# Patient Record
Sex: Male | Born: 1950 | Race: White | Hispanic: No | Marital: Married | Smoking: Former smoker
Health system: Southern US, Community
[De-identification: ages and names within clinical notes are randomized; demographics above are authoritative.]

## PROBLEM LIST (undated history)

## (undated) DIAGNOSIS — J449 Chronic obstructive pulmonary disease, unspecified: Secondary | ICD-10-CM

## (undated) DIAGNOSIS — I471 Supraventricular tachycardia, unspecified: Secondary | ICD-10-CM

## (undated) DIAGNOSIS — R739 Hyperglycemia, unspecified: Secondary | ICD-10-CM

## (undated) DIAGNOSIS — I10 Essential (primary) hypertension: Secondary | ICD-10-CM

## (undated) DIAGNOSIS — J869 Pyothorax without fistula: Secondary | ICD-10-CM

## (undated) DIAGNOSIS — Z720 Tobacco use: Secondary | ICD-10-CM

## (undated) HISTORY — DX: Supraventricular tachycardia: I47.1

## (undated) HISTORY — DX: Hyperglycemia, unspecified: R73.9

## (undated) HISTORY — DX: Supraventricular tachycardia, unspecified: I47.10

## (undated) HISTORY — DX: Essential (primary) hypertension: I10

## (undated) HISTORY — PX: CHOLECYSTECTOMY: SHX55

## (undated) HISTORY — PX: TONSILLECTOMY: SUR1361

## (undated) HISTORY — DX: Tobacco use: Z72.0

---

## 2008-03-29 ENCOUNTER — Ambulatory Visit: Payer: Self-pay | Admitting: Family Medicine

## 2010-10-24 ENCOUNTER — Ambulatory Visit: Payer: Self-pay | Admitting: Gastroenterology

## 2012-10-18 ENCOUNTER — Inpatient Hospital Stay: Payer: Self-pay | Admitting: Family Medicine

## 2012-10-18 LAB — URINALYSIS, COMPLETE
Blood: NEGATIVE
Glucose,UR: NEGATIVE mg/dL (ref 0–75)
Hyaline Cast: 1
Leukocyte Esterase: NEGATIVE
Nitrite: NEGATIVE
Protein: NEGATIVE
RBC,UR: 1 /HPF (ref 0–5)
Specific Gravity: 1.005 (ref 1.003–1.030)
Squamous Epithelial: <1
WBC UR: <1 /HPF (ref 0–5)

## 2012-10-18 LAB — COMPREHENSIVE METABOLIC PANEL
Albumin: 3.5 g/dL (ref 3.4–5.0)
Alkaline Phosphatase: 141 U/L — ABNORMAL HIGH (ref 50–136)
BUN: 20 mg/dL — ABNORMAL HIGH (ref 7–18)
Chloride: 106 mmol/L (ref 98–107)
Co2: 23 mmol/L (ref 21–32)
Creatinine: 1.16 mg/dL (ref 0.60–1.30)
EGFR (Non-African Amer.): >60
Potassium: 3.8 mmol/L (ref 3.5–5.1)
SGOT(AST): 33 U/L (ref 15–37)
SGPT (ALT): 32 U/L (ref 12–78)
Total Protein: 7 g/dL (ref 6.4–8.2)

## 2012-10-18 LAB — DRUG SCREEN, URINE
Amphetamines, Ur Screen: NEGATIVE (ref ?–1000)
Benzodiazepine, Ur Scrn: NEGATIVE (ref ?–200)
Cannabinoid 50 Ng, Ur ~~LOC~~: NEGATIVE (ref ?–50)
Methadone, Ur Screen: NEGATIVE (ref ?–300)
Opiate, Ur Screen: NEGATIVE (ref ?–300)
Phencyclidine (PCP) Ur S: NEGATIVE (ref ?–25)
Tricyclic, Ur Screen: NEGATIVE (ref ?–1000)

## 2012-10-18 LAB — CBC
HGB: 16.7 g/dL (ref 13.0–18.0)
MCH: 30.6 pg (ref 26.0–34.0)
RBC: 5.46 10*6/uL (ref 4.40–5.90)
WBC: 10.5 10*3/uL (ref 3.8–10.6)

## 2012-10-18 LAB — PROTIME-INR: INR: 1

## 2012-10-18 LAB — TROPONIN I: Troponin-I: <0.02 ng/mL

## 2012-10-18 LAB — TSH: Thyroid Stimulating Horm: 1.03 u[IU]/mL

## 2012-10-19 DIAGNOSIS — R079 Chest pain, unspecified: Secondary | ICD-10-CM

## 2012-10-19 DIAGNOSIS — I471 Supraventricular tachycardia: Secondary | ICD-10-CM

## 2012-10-19 DIAGNOSIS — I214 Non-ST elevation (NSTEMI) myocardial infarction: Secondary | ICD-10-CM

## 2012-10-19 DIAGNOSIS — R0602 Shortness of breath: Secondary | ICD-10-CM

## 2012-10-19 LAB — TROPONIN I: Troponin-I: 0.08 ng/mL — ABNORMAL HIGH

## 2012-10-19 LAB — CBC WITH DIFFERENTIAL/PLATELET
Basophil #: 0.1 10*3/uL (ref 0.0–0.1)
Basophil %: 0.8 %
Eosinophil #: 0.5 10*3/uL (ref 0.0–0.7)
HCT: 44 % (ref 40.0–52.0)
HGB: 14.9 g/dL (ref 13.0–18.0)
Lymphocyte #: 2.1 10*3/uL (ref 1.0–3.6)
Lymphocyte %: 28.8 %
MCH: 30 pg (ref 26.0–34.0)
MCHC: 33.8 g/dL (ref 32.0–36.0)
MCV: 89 fL (ref 80–100)
Monocyte #: 0.6 x10 3/mm (ref 0.2–1.0)
Monocyte %: 8.2 %
Neutrophil #: 3.9 10*3/uL (ref 1.4–6.5)
Neutrophil %: 54.9 %
WBC: 7.1 10*3/uL (ref 3.8–10.6)

## 2012-10-19 LAB — BASIC METABOLIC PANEL
BUN: 17 mg/dL (ref 7–18)
Chloride: 110 mmol/L — ABNORMAL HIGH (ref 98–107)
Glucose: 106 mg/dL — ABNORMAL HIGH (ref 65–99)
Potassium: 4.3 mmol/L (ref 3.5–5.1)
Sodium: 144 mmol/L (ref 136–145)

## 2012-10-19 LAB — LIPID PANEL
Cholesterol: 119 mg/dL (ref 0–200)
HDL Cholesterol: 29 mg/dL — ABNORMAL LOW (ref 40–60)
Triglycerides: 93 mg/dL (ref 0–200)
VLDL Cholesterol, Calc: 19 mg/dL (ref 5–40)

## 2012-10-19 LAB — CK TOTAL AND CKMB (NOT AT ARMC)
CK, Total: 313 U/L — ABNORMAL HIGH (ref 35–232)
CK-MB: 5.1 ng/mL — ABNORMAL HIGH (ref 0.5–3.6)

## 2012-10-19 LAB — HEMOGLOBIN A1C: Hemoglobin A1C: 5.7 % (ref 4.2–6.3)

## 2012-10-20 DIAGNOSIS — I471 Supraventricular tachycardia: Secondary | ICD-10-CM

## 2012-10-20 LAB — BASIC METABOLIC PANEL
Anion Gap: 4 — ABNORMAL LOW (ref 7–16)
Calcium, Total: 8.4 mg/dL — ABNORMAL LOW (ref 8.5–10.1)
Chloride: 109 mmol/L — ABNORMAL HIGH (ref 98–107)
Co2: 27 mmol/L (ref 21–32)
EGFR (Non-African Amer.): >60
Potassium: 3.9 mmol/L (ref 3.5–5.1)
Sodium: 140 mmol/L (ref 136–145)

## 2012-11-04 ENCOUNTER — Ambulatory Visit (INDEPENDENT_AMBULATORY_CARE_PROVIDER_SITE_OTHER): Payer: 59 | Admitting: Cardiovascular Disease

## 2012-11-04 ENCOUNTER — Encounter: Payer: Self-pay | Admitting: Cardiovascular Disease

## 2012-11-04 VITALS — BP 142/82 | HR 63 | Ht 74.0 in | Wt 224.2 lb

## 2012-11-04 DIAGNOSIS — R609 Edema, unspecified: Secondary | ICD-10-CM

## 2012-11-04 DIAGNOSIS — Z720 Tobacco use: Secondary | ICD-10-CM | POA: Insufficient documentation

## 2012-11-04 DIAGNOSIS — I498 Other specified cardiac arrhythmias: Secondary | ICD-10-CM

## 2012-11-04 DIAGNOSIS — I10 Essential (primary) hypertension: Secondary | ICD-10-CM

## 2012-11-04 DIAGNOSIS — I471 Supraventricular tachycardia, unspecified: Secondary | ICD-10-CM

## 2012-11-04 DIAGNOSIS — F17200 Nicotine dependence, unspecified, uncomplicated: Secondary | ICD-10-CM

## 2012-11-04 MED ORDER — LOSARTAN POTASSIUM-HCTZ 50-12.5 MG PO TABS: 1.0000 | ORAL_TABLET | Freq: Every day | ORAL | Status: DC

## 2012-11-04 NOTE — Assessment & Plan Note (Signed)
NSR in the office today. No symptoms to indicate paroxysms of SVT since discharge. Would hold off on schedule rate-control meds given bradycardia while inpatient. Would benefit from PRN Esmolol/Lopressor or short-acting diltiazem should episodes recur. Discussed vagal maneuvers and prompt EMS contact for further episodes.

## 2012-11-04 NOTE — Assessment & Plan Note (Addendum)
Suspect secondary to amlodipine and some dependence as he endorsed swelling prior to his admission. Will hold amlodipine in favor of losartan/hct 50/12.5 mg daily.

## 2012-11-04 NOTE — Assessment & Plan Note (Addendum)
BP only mildly elevated in the office today. Does have LE edema and weight gain since discharge. 2D echo did not indicate systolic/diastolic dysfunction. There was no evidence of PE. Pt reports having some swelling prior to his admission as well. Discontinue amlodipine in favor of losartan HCT 50/12.5 mg daily. If blood pressure continues to be elevated on the medication, could increase the dose to 100/25 mg daily

## 2012-11-04 NOTE — Assessment & Plan Note (Signed)
Discussed nicotine replacement therapy today as he continues to use. He would benefit from a nicotine patch taper for tobacco cessation assistance.

## 2012-11-04 NOTE — Patient Instructions (Addendum)
Hold the amlodipine for swelling Start losartan hct 50/12.5 one a day  Monitor the blood pressure If it runs high, call the office to increase the dose to 100/25 mg daily  Please call us if you have new issues that need to be addressed before your next appt.  Your physician wants you to follow-up in: 6 months.  You will receive a reminder letter in the mail two months in advance. If you don't receive a letter, please call our office to schedule the follow-up appointment.

## 2012-11-04 NOTE — Progress Notes (Signed)
Patient ID: Aaron Luna, male    DOB: 1951/01/22, 62 y.o.   MRN: [id number]  HPI Comments: Aaron Luna is a 62 yo male with no prior cardiac history, long smokign hx,  who was admitted at [facility] from 10/18/12 to 10/20/12. He presented to the ED after experiencing severe 10/10 substernal chest pressure while working out in the yard with shortness of breath, nausea and diaphoresis. Upon EMS arrival, he was found to be in SVT which converted to NSR after adenosine 6 mg then 12 mg. He immediately felt better. He did have a resultant type 2 NSTEMI.   Stress Myoview revewaled no evidence of ischemia, EF 59% and GI/diaphragmatic attenuation artifact.   2D echo LVEF 55%, normal valvular structure and functoin and normal RVSP.   CT-A chest indicated no evidence of PE, but did reveal indeterminate pulmonary nodules </= 4 mm. Follow-up CT recommended in 12 months.   He had no further episodes of SVT while inpatient. He became bradycardic on Lopressor 25mg  BID, and this was discontinued. He was started on Norvasc for HTN.  LDL in the hospital was 71, HDL 29. TSH WNL. He was discharged on low-dose ASA and fish oil. He is an ongoing tobacco user. He has reduced his use, but continues to smoke 2-3 cigarettes/day. He refuses NRT today.   He does endorse ~ 8 lbs weight gain and LE edema since being discharged.  He otherwise denies chest pain, palpitations, lightheadedness, orthopnea, PND or DOE/SOB. He feels well today. He is an ongoing tobacco user. He has reduced his use, but continues to smoke 2-3 cigarettes/day. He refuses NRT today.   EKG shows NSR with no significant ST or T wave changes.    Outpatient Encounter Prescriptions as of 11/04/2012  Medication Sig Dispense Refill  . aspirin 81 MG tablet Take 81 mg by mouth daily.      . fish oil-omega-3 fatty acids 1000 MG capsule Take 1 g by mouth daily.      Marland Kitchen amLODipine (NORVASC) 5 MG tablet Take 5 mg by mouth daily.         Review of Systems   Constitutional: Negative.  Negative for fatigue.  HENT: Positive for neck stiffness.   Eyes: Negative.   Respiratory: Negative.  Negative for cough, chest tightness and shortness of breath.   Cardiovascular: Positive for leg swelling. Negative for chest pain and palpitations.  Gastrointestinal: Negative.  Negative for nausea and abdominal pain.  Skin: Negative.   Neurological: Negative.  Negative for dizziness.  Psychiatric/Behavioral: Negative.   All other systems reviewed and are negative.    BP 142/82  Pulse 63  Ht 6\' 2"  (1.88 m)  Wt 224 lb 4 oz (101.719 kg)  BMI 28.78 kg/m2   Physical Exam  Nursing note and vitals reviewed. Constitutional: He is oriented to person, place, and time. He appears well-developed and well-nourished.  HENT:  Head: Normocephalic and atraumatic.  Nose: Nose normal.  Mouth/Throat: Oropharynx is clear and moist.  Eyes: Conjunctivae are normal. Pupils are equal, round, and reactive to light.  Neck: Normal range of motion. Neck supple. No JVD present. No tracheal deviation present.  Cardiovascular: Normal rate, regular rhythm, S1 normal, S2 normal, normal heart sounds and intact distal pulses.  Exam reveals no gallop and no friction rub.   No murmur heard. Pulmonary/Chest: Effort normal and breath sounds normal. No respiratory distress. He has no wheezes. He has no rales. He exhibits no tenderness.  Abdominal: Soft. Bowel sounds are normal.  He exhibits no distension. There is no tenderness. There is no rebound and no guarding.  Musculoskeletal: Normal range of motion. He exhibits edema. He exhibits no tenderness.  1-2+ bilateral pretibial pitting edema  Lymphadenopathy:    He has no cervical adenopathy.  Neurological: He is alert and oriented to person, place, and time. Coordination normal.  Skin: Skin is warm and dry. No rash noted. No erythema.  Psychiatric: He has a normal mood and affect. His behavior is normal. Judgment and thought content normal.       Assessment and Plan

## 2013-01-07 ENCOUNTER — Telehealth: Payer: Self-pay | Admitting: *Deleted

## 2013-01-07 ENCOUNTER — Other Ambulatory Visit: Payer: Self-pay

## 2013-01-07 MED ORDER — LOSARTAN POTASSIUM-HCTZ 100-25 MG PO TABS: 1.0000 | ORAL_TABLET | Freq: Every day | ORAL | Status: DC

## 2013-01-07 NOTE — Telephone Encounter (Signed)
Patient is going to fax over his BP readings today and wants to know if he needs to change any of the bp medications, he is almost out of his meds and wants the RN to call him back today after receiving the readings.

## 2013-01-07 NOTE — Telephone Encounter (Signed)
FYI

## 2013-01-07 NOTE — Telephone Encounter (Signed)
Pt faxed BP readings with average BP=151/86, HR=77 He is out of losartan/HCT 25/12.5 I advised, per Dr. Windell Hummingbird last office note, to go ahead and increase losartan/HCT to 50/25 mg daily and to continue to monitor BP Understanding verb RX sent to pharmacy

## 2013-01-20 ENCOUNTER — Other Ambulatory Visit: Payer: Self-pay | Admitting: Cardiovascular Disease

## 2013-01-20 ENCOUNTER — Telehealth: Payer: Self-pay

## 2013-01-20 NOTE — Telephone Encounter (Signed)
Pt says since switching to higher losartan/HCTZ dose (50/25), he has been dizzy and near syncopal He held medications x 3 days and symptoms resolved BP remains elevated=155/96 Still c/o LE edema  I advised he go back taking losartan/HCT 25/12.5 mg daily since this helped his BP a little bit more  In mean time, I will ask Dr. Mariah Milling if we need to switch him to a different medication for BP, etc Understanding verb

## 2013-01-20 NOTE — Telephone Encounter (Signed)
Losartan hctz could have made him dehydrated? Like he was taking 50/12.5 mg daily?  Best thing may be for [place] to split the 2 medications and for him to try losartan 50 mg daily We could add low-dose HCTZ later if needed if blood pressure runs high as a separate pill

## 2013-01-20 NOTE — Telephone Encounter (Signed)
Pt states new medication Hyzaar has caused him to be swimmy headed and passing out. Pt states he has stopped this. Please advise on what he should take

## 2013-01-21 ENCOUNTER — Other Ambulatory Visit: Payer: Self-pay

## 2013-01-21 MED ORDER — LOSARTAN POTASSIUM 25 MG PO TABS: 25.0000 mg | ORAL_TABLET | Freq: Every day | ORAL | Status: DC

## 2013-01-21 NOTE — Telephone Encounter (Signed)
Pt informed Would like to try losartan 25 mg daily first then can increase to 50 mg if he tolerates RX sent to pharm

## 2013-03-08 ENCOUNTER — Telehealth: Payer: Self-pay

## 2013-03-08 DIAGNOSIS — R609 Edema, unspecified: Secondary | ICD-10-CM

## 2013-03-08 DIAGNOSIS — I10 Essential (primary) hypertension: Secondary | ICD-10-CM

## 2013-03-08 NOTE — Telephone Encounter (Signed)
One option would be to hold losartan  continue HCTZ 25 mg daily Also start clonidine 0.1 mg twice a day  Would track blood pressures and call us back with numbers

## 2013-03-08 NOTE — Telephone Encounter (Signed)
Called spoke with pt took last dosage of Losartan yesterday am, swelling in B feet and hands is improving.  Pt states he monitoring BP regularly never has gotten below 140/90's on the Losartan.  Started on Losartan/HCT in 05/14, stopped changed to Losartan 25mg  QD and has been taking since 6/14.  Please advise.

## 2013-03-08 NOTE — Telephone Encounter (Signed)
Pt states he has swelling in his ankles, left and right hands, states he went off Losartin 2 days ago, and his swelling has gotten better. States this medication never did help his BP, it was never below 140/90. Please call

## 2013-03-09 MED ORDER — HYDROCHLOROTHIAZIDE 25 MG PO TABS: 25.0000 mg | ORAL_TABLET | Freq: Every day | ORAL | Status: DC

## 2013-03-09 MED ORDER — CLONIDINE HCL 0.1 MG PO TABS: 0.1000 mg | ORAL_TABLET | Freq: Two times a day (BID) | ORAL | Status: DC

## 2013-03-09 NOTE — Telephone Encounter (Signed)
I spoke with the patient. He has been holding losartan for 48 hours. His swelling is improving. I have advised of Dr. Windell Hummingbird recommendations to continue off losartan and start HCTZ 25 mg daily and clonidine 0.1 mg BID. The patient is agreeable. Will send RX's to [facility] in [place]. The patient has been instructed to track his BP for the next 2 weeks and call us back with his readings. He has been advised to call sooner with any concerns. He voices understanding.

## 2013-07-15 ENCOUNTER — Ambulatory Visit: Payer: Self-pay

## 2013-10-30 ENCOUNTER — Ambulatory Visit: Payer: Self-pay | Admitting: Physician Assistant

## 2014-03-02 ENCOUNTER — Other Ambulatory Visit: Payer: Self-pay | Admitting: *Deleted

## 2014-03-02 DIAGNOSIS — I10 Essential (primary) hypertension: Secondary | ICD-10-CM

## 2014-03-02 MED ORDER — CLONIDINE HCL 0.1 MG PO TABS: 0.1000 mg | ORAL_TABLET | Freq: Two times a day (BID) | ORAL | Status: DC

## 2014-03-02 NOTE — Telephone Encounter (Signed)
Requested Prescriptions   Signed Prescriptions Disp Refills  . cloNIDine (CATAPRES) 0.1 MG tablet 60 tablet 1    Sig: Take 1 tablet (0.1 mg total) by mouth 2 (two) times daily.    Authorizing Provider: Antonieta IbaGOLLAN, TIMOTHY J    Ordering User: Kendrick FriesLOPEZ, Nicki Furlan C

## 2014-12-02 NOTE — Discharge Summary (Signed)
PATIENT NAME:  Aaron Luna, Aaron Luna MR#:  [id number] DATE OF BIRTH:  02/25/1951  DATE OF ADMISSION:  10/18/2012 DATE OF DISCHARGE:  10/20/2012  REASON FOR ADMISSION:  Chest pain and supraventricular tachycardia.   DISCHARGE DIAGNOSES: 1.  New-onset supraventricular tachycardia.  2.  Chest pain.  3.  Hypertension.  4.  Hyperglycemia.  5.  Tobacco abuse.   MEDICATIONS AT DISCHARGE:  Aspirin 81 mg once daily, amlodipine 5 mg once daily, fish oil 1000 mg 3 times a day.   CONSULTANT:  Dr. Julien Nordmann.   FOLLOWUP: Dr. Julien Nordmann in the [facility] in 1 to 2 weeks, and Dr. Elizabeth Sauer, primary care physician, also in 1 to 2 weeks.   IMPORTANT RESULTS:  1.  Echocardiogram showing left ventricular ejection fraction of 55%, essentially normal . All cardiac valves are normal in structure and function with normal right ventricular systolic pressures.  2.  Nuclear stress test showed perfusion imaging study with no evidence of significant ischemia. There is an attenuation-corrected image needed secondary to GI uptake artifact and diaphragmatic attenuation artifact. Ejection fraction of 59%. No significant EKG changes concerning for ischemia. No recent scan.  3.  CT of the chest to rule out pulmonary embolism did not show any signs of pulmonary embolism. There are indeterminate pulmonary nodules which are 4 mm or less. The patient is a smoker, for which he needs a 53-month followup with a noncontrast CT to be ordered by  Dr. Elizabeth Sauer.   Admission glucose 160, but normalized to 93 at discharge. Electrolytes within normal limits. Kidney function within normal limits. LDL of 71, HDL of 29.   LFTs within normal limits, except for mild elevation of the alkaline phosphatase to 141. Troponin jumped from 0.02 to 0.08, and at discharge 0.05. TSH 1.03. UDS negative. Hemoglobin of 16.7, white count of 10.5. UA: No signs of urinary infection.   EKG: Supraventricular tachycardia on admission. Normal  sinus rhythm at discharge.   HOSPITAL COURSE: The patient is a very nice 64 year old gentleman who has a history of smoking. Presented to the ER with new-onset supraventricular tachycardia.   Pt having shortness of breath with chest pressure and sweating. Apparently, the patient was working in his yard. He was picking up some of the leaves from the trees that were on the floor and after awhile started complaining of chest pain. He suddenly felt the acute chest pain, pressure-like, followed by significant shortness of breath and nausea. He was sweating, and he felt like an elephant was sitting on the level of his chest. This has never happened before to this intensity; although, the patient states that he has occasional chest pain that happened within the same setting with similar characteristics, but never as intense.   EMS was called by his wife. He was found to be in narrow-complex tachycardia/SVT above 200, for which he was given adenosine of 6 mg, followed by 12 mg.   The SVT broke down into normal sinus rhythm, and the patient started feeling significantly better right away. He was admitted for evaluation of this problem.   1.  New-onset supraventricular tachycardia: The patient had a slight elevation of troponin, which was due to the significant elevation of the heart rate. He did not have any significant concerns of acute coronary syndrome at the moment; although, due to his risk factors, hypertension, smoking, and pains similar to those, Cardiology was consulted. With it, a stress test Myoview scan was overall negative. The patient was advised that if  he continues to have the pains that he has been having occasionally to go back and see  Cardiology.  2.  The patient also was recommended that if he started having some significant palpitations or increase of his heart rate at the same levels to visit Cardiology as well. I spoke with Dr. Mariah MillingGollan,  cardiology, who states that at this moment since this  is the first event and significant and since most of his workup was negative, he does not need to have Holter monitoring, but if this happens again he could visit [facility] and get set up for one.  3.  As far as his tobacco abuse, the patient was given extensive counseling. I offered help. He states that he is going to quit.  4.  As far as his newly-diagnosed hyperlipidemia, the patient has normal, very good LDL, but a very low HDL which is likely due to smoking, for which smoking cessation was again recommended. The patient was discharged on fish oil.  5.  As far as his blood pressure, blood pressure has never been treated, or at least he states that he is not taking medications. His blood pressure was significantly elevated at admission for which he was started on metoprolol to help the SVT. Unfortunately, the patient's heart rate dropped down to the 50s, for which we had to stop metoprolol and start him on amlodipine. The patient is discharged on a low-dose of amlodipine, to follow up with Dr. Elizabeth Sauereanna Jones for adjustment of the dose.  6.  As far as any other medical problems, they were stable. The patient was discharged in good condition.   I spent about 45 minutes with this patient the day of discharge. The case was discussed also with Dr. Mariah MillingGollan.    ____________________________ Felipa Furnaceoberto Sanchez Gutierrez, MD rsg:dm D: 10/24/2012 14:05:00 ET T: 10/25/2012 10:58:28 ET JOB#: [id number]  cc: Duanne Limerickeanna C. Jones, MD Antonieta Ibaimothy J. Gollan, MD Felipa Furnaceoberto Sanchez Gutierrez, MD, <Dictator>  [name] ELECTRONICALLY SIGNED 11/05/2012 17:18

## 2014-12-02 NOTE — H&P (Signed)
DATE OF BIRTH:  [birth year]  DATE OF ADMISSION:  10/18/2012  REFERRING PHYSICIAN:  Dr. Benjaman Lobe  PRIMARY CARE PHYSICIAN:  Dr.  Otilio Miu   CHIEF COMPLAINT:  Sudden shortness of breath, with chest pressure and sweating.   HISTORY OF PRESENT ILLNESS:  This is a 64 year old Caucasian male with past medical history of hypertension, previous cholecystectomy, tobacco abuse, who presented with the above chief complaint. History is per the patient. The patient states that today he was out in the yard doing wood chopping, fallen trees and taking care of some landscaping, when all of a sudden he felt an acute onset of chest pressure followed by shortness of breath and nausea. He was already sweating, but he thinks he broke out in some more sweats. He said he felt like an elephant was sitting on his chest, and he has never felt anything like this before. He wife called EMS and he was brought to the ED right away, where he was found to be in SVT, narrow complex heart rate greater than 200. He was given adenosine twice, the first dose was 6 mg followed by 12 mg. This broke his SVT back into sinus rhythm. He also given a dose of Lovenox by the ED physician. CKMB was noted to be elevated at 5.3, and hospitalist was consulted for further inpatient workup and management. At the time of my evaluation, patient denies any chest pain, chest pressure, shortness of breath, numbness in his left shoulder, or any nausea or vomiting.   PAST MEDICAL HISTORY:  Hypertension, tobacco abuse.   MEDICATIONS:  None.  PAST SURGICAL HISTORY:  Cholecystectomy years ago.   ALLERGIES:  No known drug allergies.   FAMILY HISTORY:  Father and mother with heart disease.   SOCIAL HISTORY:  A smoker, occasional drinker.   REVIEW OF SYSTEMS AS FOLLOWS:  CONSTITUTIONAL:  No fever, fatigue. No weakness. Chest pain positive. No weight loss or weight gain.  EYES:  No blurry vision, double vision, pain or redness.  EARS, NOSE, THROAT:   No tinnitus, ear pain, hearing loss. No seasonal allergies.  RESPIRATORY: Positive shortness of breath. Positive mild chronic cough, from smoking is what he says. No COPD, TB or pneumonia.  CARDIOVASCULAR: Positive chest pain. Positive chest pressure. Positive diaphoresis. Positive high blood pressure. Positive arrhythmia.  GASTROINTESTINAL: Positive nausea. Otherwise, no vomiting, diarrhea, hematemesis, melena. Also positive GERD symptoms, is what he said.   ENDOCRINOLOGY:  No polyuria, nocturia, thyroid problems. No increased sweating. No heat or cold intolerance.  MUSCULOSKELETAL: No pain in neck, back, shoulder, knee, hips, or arthritis.  NEUROLOGIC:  No numbness, no weakness, no dysarthria. No epilepsy, tremor or vertigo.  PSYCHIATRIC:  No anxiety, OCD, bipolar or depression.   PHYSICAL EXAMINATION:  VITAL SIGNS:  In the ED, his vital signs were noted to be temperature of 97, blood pressure 191/116, saturating 100% on oxygen, it looks like 2 liters, respirations 27, pulse 92.  GENERAL APPEARANCE: Well-developed, well-nourished male lying in bed in no acute respiratory distress.  HEENT: PERRLA, EOMI. No scleral icterus noted. No difficulty hearing. No pharyngeal erythema. Mucous membranes are moist. Dentition is fair.  NECK: No thyroid enlargement, nodules or tenderness. Neck is supple, nontender. No adenopathy is noted. No JVD or carotid bruits.  RESPIRATORY: Clear to auscultation bilaterally. No rales, rhonchi, crackles or diminished breath sounds.  CARDIOVASCULAR: Regular rate, regular rhythm. No murmurs. S1, S2 auscultated. No PMI lateralization is noted. No lower extremity edema.  ABDOMEN: Soft, nontender, nondistended. Positive bowel  sounds.  MUSCULOSKELETAL: Strength five out of five bilateral upper and lower extremities. No cyanosis, DJD, kyphosis.  SKIN:  No rash, lesions, erythema, nodules. Skin is warm and dry.  LYMPHATIC:  No adenopathy noted in the cervical, axillary or  supraclavicular regions.  NEUROLOGIC: Cranial nerves II through XII intact. Deep tendon reflexes intact. Sensory is intact.  PSYCHIATRIC:  Alert, oriented to person, time and place. Cooperative. Good judgment is noted.   PERTINENT LABS ARE AS FOLLOWS:  Sodium 130, potassium 3.8, chloride 106, BUN 20, creatinine 1.16, glucose 160. AST 33, ALT 32, alk phos 141. CKMB was noted to be at 5.3. Total CK 354. Troponin less than 0.02. Thyroid 1.03. Drug screen is negative. CBC is as follows:  White cell count 10.5, hemoglobin 16.7, hematocrit 48.5, platelet count 235. PT/INR 13/1.0. His UA was negative. EKG, initially when he came in, showed narrow complex tachycardia consistent with SVT, broke with 2 doses of adenosine. Currently he is in normal sinus rhythm at a rate of 90 to 100, with no acute ST-T wave changes. He does not have a current chest x-ray, it is currently pending, and the CT of the chest is pending.    ASSESSMENT AND PLAN IS AS FOLLOWS:  A 64 year old male with past medical history of hypertension and tobacco abuse, with acute onset shortness of breath, chest pressure and chest pain, found to be in supraventricular tachycardia in the Emergency Department.  1.  New onset supraventricular tachycardia, currently in normal sinus rhythm with adenosine x 2, now with elevated CKMB. Patient got Lovenox and adenosine in the ED. Will continue with telemetry monitoring, check cardiac enzymes x 3, EKG in the morning. Cardiology consult in the morning for possible stress testing evaluation. Check a CT of the chest. The patient did present with acute shortness of breath. He is a smoker, and he does have risk factors associated with it. Continue medical management with aspirin, beta blocker, statin, nitro sublingual, p.r.n. morphine and p.r.n. O2. Check a lipid panel and hemoglobin A1c also. Will also check an echo.  2.  Hypertension. Blood pressure elevated in the ED in the 389H systolic. Has not taken any  medications for the past 1.5 years and previously seen by Dr. Ronnald Ramp, but stopped taking his meds. I suspect that his blood pressure has been running high for the past year, and therefore will not acutely lower his blood pressure in the next 24 hours. However, goal blood pressure for the next 24 hours will be about 734 to 287 systolic. Postop metoprolol, and add p.r.n. if needed.   3.  Hyperglycemia. Check a hemoglobin A1c and monitor his fasting blood glucose levels.   4.  Tobacco abuse. The patient had consult with tobacco cessation. Will start a nicotine patch.   Time spent dictating and evaluating the patient:  50 minutes.     ____________________________ Vilinda Boehringer, MD vm:mr D: 10/18/2012 21:26:56 ET T: 10/18/2012 21:52:37 ET JOB#: [id number]  cc: Vilinda Boehringer, MD, <Dictator> Vilinda Boehringer MD ELECTRONICALLY SIGNED 10/19/2012 1:01

## 2014-12-02 NOTE — Consult Note (Signed)
General Aspect Aaron Luna is a 64yo male with PMHx s/f HTN and ongoing tobacco abuse who was admitted to [facility] yesterday with chest tightness and new onset SVT.   Present Illness He reports experiencing intermittent L-sided chest squeezing at rest lasting for <1 minute at a time x 2 months. He was without power yesterday and was chopping wood outside when he experienced a sudden chest tightess like a "truck on my chest" radiating to his left arm with associated nausea, diaphoresis and shortness of breath. He was weak, and had to be helped inside. There, he chewed two full-dose ASA and EMS was called. Wife notes "he hasn't felt right" x 2 months. No active bleeding. No fevers or chills. No increased caffeine or alcohol use. Father had bypass in his 92s.   In the ED, EKG revealed SVT. Initial trop-I returned WNL. CBC, U/A, TSH and UDS WNL. LFTs unremarkable. He was given adenosine 6 mg x 1, then 12 mg x 1 with conversion to NSR. He was started on Lovenox and admitted by the medicine service. VSS. He was started on full-dose ASA, BB, statin, NTG SL PRN. A subsequent trop-I returned mildly elevated and the last set returned WNL.   A1C 5.7%. Lipid panel- LDL 71, HDL 29, TG 93, TC 119.   PAST MEDICAL HISTORY:  Hypertension, tobacco abuse.   MEDICATIONS:  None.  PAST SURGICAL HISTORY:  Cholecystectomy years ago.   ALLERGIES:  No known drug allergies.   FAMILY HISTORY:  Father and mother with heart disease.   SOCIAL HISTORY:  A smoker, occasional drinker.   Physical Exam:  GEN no acute distress   HEENT PERRL, hearing intact to voice   NECK supple  No masses  trachea midline   RESP normal resp effort  clear BS  no use of accessory muscles  trace wheezing in posterior lung fields   CARD Regular rate and rhythm  Normal, S1, S2  No murmur   ABD denies tenderness  denies Flank Tenderness  soft  no Adominal Mass   EXTR negative cyanosis/clubbing, negative edema   SKIN No rashes, No ulcers    NEURO motor/sensory function intact   PSYCH A+O to time, place, person, good insight   Review of Systems:  Subjective/Chief Complaint chest pain   General: No Complaints  Fatigue   Skin: No Complaints   ENT: No Complaints   Eyes: No Complaints   Neck: No Complaints   Respiratory: Short of breath  Wheezing   Cardiovascular: Chest pain or discomfort   Gastrointestinal: No Complaints   Genitourinary: No Complaints   Vascular: No Complaints   Musculoskeletal: No Complaints   Neurologic: No Complaints   Hematologic: No Complaints   Endocrine: No Complaints   Psychiatric: No Complaints   Review of Systems: All other systems were reviewed and found to be negative   Medications/Allergies Reviewed Medications/Allergies reviewed     Hypertension:    Cholecystectomy:    Tonsillectomy:        Admit Diagnosis:   SVT: Onset Date: 19-Oct-2012, Status: Active, Description: SVT      Admit Reason:   Acute coronary syndrome (411.1): Status: Active, Coding System: ICD9, Coded Name: Intermediate coronary syndrome   SVT (supraventricular tachycardia) (427.89): Status: Active, Coding System: ICD9, Coded Name: Other specified cardiac dysrhythmias    Sodium Chloride 0.9% injection, 2 ml, IV push, q6h, 18-Oct-2012, Active, Standard   Adenosine injection, 6 mg, IV push, once  Indication: SVT/ Diagnostic Aid Myocardial Perfusion, Rapid push followed  by rapid saline flush., 18-Oct-2012, Completed, Standard   Adenosine injection, 12 mg, IV push, once  Indication: SVT/ Diagnostic Aid Myocardial Perfusion, Rapid push followed by rapid saline flush., 18-Oct-2012, Completed, Standard   Acetaminophen * tablet, ( Tylenol (325 mg) tablet)  650 mg Oral q4h PRN for pain or temp. greater than 100.4  - Indication: Pain/Fever, 18-Oct-2012, Active, Standard   Aspirin Enteric Coated tablet, ( Ecotrin)  325 mg Oral daily  - Indication: Pain/Fever/Thromboembolic Disorders/Post  MI/Prophylaxis MI  Instructions:  Initiate Bleeding Precautions Protocol--DO NOT CRUSH, 18-Oct-2012, Active, Standard   Enoxaparin injection, ( Lovenox injection )  40 mg, Subcutaneous, daily  Indication: Prophylaxis or treatment of thromboembolic disorders, Monitor Anticoags per hospital protocol, 18-Oct-2012, Discontinued, Standard   meTOProlol tartrate tablet, ( Lopressor)  50 mg Oral q12h  - Indication: Antihyperensive/ Angina, 18-Oct-2012, Discontinued, Standard   Ondansetron injection, ( Zofran injection )  4 mg, IV push, q4h PRN for Nausea/Vomiting  Indication: Nausea/ Vomiting, 18-Oct-2012, Active, Standard   Pantoprazole tablet, 40 mg Oral q6am  - Indication: Erosive Esophagitis/ GERD  Instructions:  DO NOT CRUSH, 18-Oct-2012, Active, Standard   atorvaSTATin tablet,  ( Lipitor)  20 mg Oral daily  - Indication: Hypercholesterolemia, 18-Oct-2012, Active, Standard   Nitroglycerin tablet, ( Nitrostat SL)  0.4 mg Sublingual Q5M PRN for chest pain  - Indication: Angina/ Hypertension  Instructions:  q 5 minutes x 3 doses PRN, 18-Oct-2012, Active, Standard   meTOProlol injection,  ( Lopressor injection )  5 mg, IV push, q6h PRN for hypertension  Indication: Antihypertensive/ Angina, give for SBP>116mHg  Hold for HR <65 bpm, 18-Oct-2012, Active, Standard   Enoxaparin injection,  ( Lovenox injection )  40 mg, Subcutaneous, daily  Indication: Prophylaxis or treatment of thromboembolic disorders, Monitor Anticoags per hospital protocol, 19-Oct-2012, Active, Standard   meTOProlol tartrate tablet, ( Lopressor)  25 mg Oral bid  - Indication: Antihypertensive/ Angina  Instructions:  hold for hr < 55., 19-Oct-2012, Active, Standard   Nicotine Patch, ( Habitrol patch )  14 mg Transdermal daily  -Indication:Smoking Cessation  Instructions:  [Dustin FolksCode: Black with pkg], 19-Oct-2012, Active, Standard  Lab Results:  Cardiology:  09-Mar-14 16:51   Ventricular Rate 195  Atrial  Rate 202  QRS Duration 84  QT 236  QTc 425  R Axis 69  T Axis -36  ECG interpretation Supraventricular tachycardia Septal infarct , 65 undetermined Marked ST abnormality, possible inferior subendocardial injury Abnormal ECG No previous ECGs available ----------unconfirmed---------- Confirmed by OVERREAD, NOT ([id number]), editor PEARSON, BARBARA ([id number] on 10/19/2012 1:38:13 PM  Routine Chem:  10-Mar-14 01:55   Result Comment TROPONIN - RESULTS VERIFIED BY REPEAT TESTING.  - C/BROOKE ROBERTSON AT 078293/10/14.PMH  - READ-BACK PROCESS PERFORMED.  Result(s) reported on 19 Oct 2012 at 02:59AM.  Glucose, Serum  106  BUN 17  Creatinine (comp) 0.95  Sodium, Serum 144  Potassium, Serum 4.3  Chloride, Serum  110  CO2, Serum 30  Calcium (Total), Serum  8.2  Anion Gap  4  Osmolality (calc) 289  eGFR (African American) >60  eGFR (Non-African American) >60 (eGFR values <663mmin/1.73 m2 may be an indication of chronic kidney disease (CKD). Calculated eGFR is useful in patients with stable renal function. The eGFR calculation will not be reliable in acutely ill patients when serum creatinine is changing rapidly. It is not useful in  patients on dialysis. The eGFR calculation may not be applicable to patients at the low and high extremes of body sizes, pregnant women, and vegetarians.)  Magnesium, Serum 2.2 (1.8-2.4 THERAPEUTIC RANGE: 4-7 mg/dL TOXIC: > 10 mg/dL  -----------------------)  Hemoglobin A1c ([facility]) 5.7 (The American Diabetes Association recommends that a primary goal of therapy should be <7% and that physicians should reevaluate the treatment regimen in patients with HbA1c values consistently >8%.)  Cholesterol, Serum 119  Triglycerides, Serum 93  HDL (INHOUSE)  29  VLDL Cholesterol Calculated 19  LDL Cholesterol Calculated 71 (Result(s) reported on 19 Oct 2012 at 02:21AM.)  Urine Drugs:  [id number] 69:67   Tricyclic Antidepressant, Ur Qual (comp) NEGATIVE (Result(s) reported  on 18 Oct 2012 at 07:51PM.)  Amphetamines, Urine Qual. NEGATIVE  MDMA, Urine Qual. NEGATIVE  Cocaine Metabolite, Urine Qual. NEGATIVE  Opiate, Urine qual NEGATIVE  Phencyclidine, Urine Qual. NEGATIVE  Cannabinoid, Urine Qual. NEGATIVE  Barbiturates, Urine Qual. NEGATIVE  Benzodiazepine, Urine Qual. NEGATIVE (----------------- The URINE DRUG SCREEN provides only a preliminary, unconfirmed analytical test result and should not be used for non-medical  purposes.  Clinical consideration and professional judgment should be  applied to any positive drug screen result due to possible interfering substances.  A more specific alternate chemical method must be used in order to obtain a confirmed analytical result.  Gas chromatography/mass spectrometry (GC/MS) is the preferred confirmatory method.)  Methadone, Urine Qual. NEGATIVE  Cardiac:  10-Mar-14 01:55   CK, Total  313  CPK-MB, Serum  5.1 (Result(s) reported on 19 Oct 2012 at 02:45AM.)  Troponin I  0.08 (0.00-0.05 0.05 ng/mL or less: NEGATIVE  Repeat testing in 3-6 hrs  if clinically indicated. >0.05 ng/mL: POTENTIAL  MYOCARDIAL INJURY. Repeat  testing in 3-6 hrs if  clinically indicated. NOTE: An increase or decrease  of 30% or more on serial  testing suggests a  clinically important change)    09:51   CK, Total  309  CPK-MB, Serum  5.0 (Result(s) reported on 19 Oct 2012 at 11:03AM.)  Troponin I 0.05 (0.00-0.05 0.05 ng/mL or less: NEGATIVE  Repeat testing in 3-6 hrs  if clinically indicated. >0.05 ng/mL: POTENTIAL  MYOCARDIAL INJURY. Repeat  testing in 3-6 hrs if  clinically indicated. NOTE: An increase or decrease  of 30% or more on serial  testing suggests a  clinically important change)  Routine UA:  09-Mar-14 19:08   Color (UA) Straw  Clarity (UA) Clear  Glucose (UA) Negative  Bilirubin (UA) Negative  Ketones (UA) Trace  Specific Gravity (UA) 1.005  Blood (UA) Negative  pH (UA) 6.0  Protein (UA) Negative   Nitrite (UA) Negative  Leukocyte Esterase (UA) Negative (Result(s) reported on 18 Oct 2012 at 07:40PM.)  RBC (UA) 1 /HPF  WBC (UA) <1 /HPF  Bacteria (UA) NONE SEEN  Epithelial Cells (UA) <1 /HPF  Hyaline Cast (UA) 1 /LPF (Result(s) reported on 18 Oct 2012 at 07:40PM.)  Routine Hem:  10-Mar-14 01:55   WBC (CBC) 7.1  RBC (CBC) 4.96  Hemoglobin (CBC) 14.9  Hematocrit (CBC) 44.0  Platelet Count (CBC) 196  MCV 89  MCH 30.0  MCHC 33.8  RDW 12.9  Neutrophil % 54.9  Lymphocyte % 28.8  Monocyte % 8.2  Eosinophil % 7.3  Basophil % 0.8  Neutrophil # 3.9  Lymphocyte # 2.1  Monocyte # 0.6  Eosinophil # 0.5  Basophil # 0.1 (Result(s) reported on 19 Oct 2012 at 02:11AM.)   Radiology Results: XRay:    09-Mar-14 18:36, Chest Portable Single View  Chest Portable Single View   REASON FOR EXAM:    sob  COMMENTS:  PROCEDURE: DXR - DXR PORTABLE CHEST SINGLE VIEW  - Oct 18 2012  6:36PM     RESULT: The lungs are clear. The cardiac silhouette and visualized bony   skeleton are unremarkable. The patient's taken a shallow inspiration.    IMPRESSION:      1. Chest radiograph without evidence of acute cardiopulmonary disease.        Verified By: Mikki Santee, M.D., MD  Cardiology:    10-Mar-14 08:01, Echo Doppler  Echo Doppler   REASON FOR EXAM:      COMMENTS:       PROCEDURE: [facility] - ECHO DOPPLER COMPLETE  - Oct 19 2012  8:01AM     RESULT: Echocardiogram Report    Patient Name:   Aaron Luna [facility] Date of Exam: 10/19/2012  Medical Rec #:  [id number]           Custom1:  Date of Birth:  Jul 01, 1951         Height:       74.0 in  Patient Age:    86 years         Weight:       220.5 lb  Patient Gender: M                BSA:          2.27 m??    Indications: SOB  Sonographer:    JERRY HEGE RDCS  Referring Phys: Vilinda Boehringer    Sonographer Comments: No parasternal window.    2D AND M-MODE MEASUREMENTS (normal ranges within parentheses):  Left Ventricle:          Normal     Aorta/Left Atrium:    Normal  IVSd (2D):      0.97 cm (0.7-1.1) Aortic Root (Mmode):  (2.4-3.7)  LVPWd (2D): 1.31 cm (0.7-1.1) AoV Cusp Separation:  (1.5-2.6)  LVIDd (2D):     4.27 cm (3.4-5.7) Left Atrium (Mmode):  (1.9-4.0)  LVIDs (2D):     2.67 cm  LV FS (2D):     37.5 %   (>25%)   Aorta/LA:                  Normal  LV EF (2D):     67.8 %   (>50%)   Aortic Root (2D): 2.90 cm (2.4-3.7)                                    Left Atrium (2D): 3.00 cm (1.9-4.0)                                      Right Ventricle:                                    RVd (2D):  LV DIASTOLIC FUNCTION:  MV Peak E: 1.02 m/s E/e' Ratio: 9.20  MV Peak A: 0.62 m/s Decel Time: 243 msec  E/A Ratio: 1.65  SPECTRAL DOPPLER ANALYSIS (where applicable):  Mitral Valve:  MV P1/2 Time: 70.47 msec  MV Area, PHT: 3.12 cm??  Aortic Valve: AoV Max Vel: 1.07 m/s AoV Peak PG: 4.6 mmHg AoV Mean PG:  LVOT Vmax: 0.89 m/s LVOT VTI:  LVOT Diameter: 2.00 cm  AoV Area, Vmax: 2.61 cm?? AoV Area,  VTI:  AoV Area, Vmn:  Tricuspid Valve and PA/RV Systolic Pressure: TR Max Velocity: 2.09 m/s RA   Pressure: 5 mmHg RVSP/PASP: 22.5 mmHg  Pulmonic Valve:  PV Max Velocity: 0.86 m/s PV Max PG: 2.9 mmHg PV Mean PG:    PHYSICIAN INTERPRETATION:  Left Ventricle: Normal left ventricular size and wall thicknesses, with   normal systolic and diastolic function. The left ventricular internal   cavity size was normal. LV posterior wall thickness was normal. Left   ventricular ejection fraction, by visual estimation, is 55 to 60%.   Spectral Doppler shows normal pattern of LV diastolic filling.  Right Ventricle: Normal right ventricular size, wall thickness, and   systolic function.  Left Atrium: The left atrium is normal in size and structure.  Right Atrium: The right atrium is normal in size and structure.  Pericardium: There is no evidence of pericardial effusion. There is   pleural effusion in either the left or right lateral region.  Mitral  Valve: Structurally normal mitral valve, with normal leaflet     excursion; without any evidence of mitral stenosis or significant   regurgitation.  Tricuspid Valve: Structurally normal tricuspid valve, with normal leaflet   excursion. The tricuspid valve is normal. Trivial tricuspid regurgitation   is visualized. The tricuspid regurgitant velocity is 2.09 m/s, and with   an assumed right atrial pressure of 5 mmHg, the estimated right   ventricular systolic pressure is normal at 22.5 mmHg.  Aortic Valve: The aortic valve is trileaflet and structurally normal,   with normal leaflet excursion; without any evidence of aortic stenosis or   insufficiency.  Pulmonic Valve: Structurally normal pulmonic valve,with normal leaflet   excursion.  Aorta: The aortic root and ascending aorta are structurally normal, with   no evidence of dilitation.  Pulmonary Artery: The main pulmonary artery is normal in size, origin and     position; with normal bifurcation into the left and right pulmonary   arteries.  Shunts: There is no evidence of a patent ductus arteriosus. There is no   evidence of a patent foramen ovale. No ventricular septal defect is seen   or detected. There is no evidence of an atrial septal defect.    Summary:   1. Left ventricular ejection fraction, by visual estimation, is 55 to   60%.   2. Essentially a normal Echocardiogram.   3. All cardiac valves are normal in structure and function.   4. Normal RVSP  [id number] Ida Rogue MD  Electronically signed by [id number] Ida Rogue MD  Signature Date/Time: 10/19/2012/6:44:15 PM  *** Final ***    IMPRESSION: .        Verified By: Minna Merritts, M.D., MD  CT:    09-Mar-14 21:39, CT Chest for Pulm Embolism With Contrast  CT Chest for Pulm Embolism With Contrast   REASON FOR EXAM:    acute sob with svt  COMMENTS:       PROCEDURE: CT  - CT CHEST (FOR PE) W  - Oct 18 2012  9:39PM     RESULT: Comparison: None    Technique:  Multiple thin section axial images were obtained from the lung   apices to the upper abdomen following 80 ml Isovue 370 intravenous   contrast, according to the PE protocol. These images were also reviewed   on a Siemens multiplanar work station.    Findings:   No mediastinal, hilar or, or axillary lymphadenopathy. The thyroid gland   is mildly heterogeneous,  which is nonspecific. There is a possible small     hiatal hernia.    The thoracic aorta is normal in caliber. No pulmonary embolus identified.    Minimal subpleural apical groundglass opacities and intralobular septal   thickeningand the right upper lobe may be related areas of scarring or   fibrosis. Mild right basilar dependent opacity is slightly secondary to   atelectasis. The central airways are patent. There is a 4 mm nodule in   the left upper lobe, image 49. There area few other 2-3 mm nodules   scattered throughout the lungs.     No aggressive lytic or sclerotic osseous lesions are identified.    IMPRESSION:   1. No pulmonary embolus identified.  2. Indeterminate pulmonary nodules which are 4 mm or less. If the patient   is at low risk for lung cancer, no further follow-up is recommended.  If   the patient is at high risk for lung cancer, recommend 12 month follow-up   noncontrast chest CT.        Verified By: Gregor Hams, M.D., MD    No Known Allergies:   Vital Signs/Nurse's Notes: **Vital Signs.:   10-Mar-14 09:28  Vital Signs Type Routine  Temperature Temperature (F) 98  Celsius 36.6  Temperature Source oral  Pulse Pulse 57  Respirations Respirations 18  Systolic BP Systolic BP 782  Diastolic BP (mmHg) Diastolic BP (mmHg) 78  Mean BP 98  Pulse Ox % Pulse Ox % 93    Impression Mr. Bevill is a 64yo male with PMHx s/f HTN and ongoing tobacco abuse who was admitted to [facility] yesterday with chest tightness and new onset SVT.   Plan 1. SVT, new onset 2. NSTEMI, type 2  3. Pulmonary nodules  4.  Ongoing tobacco abuse 5. Hypertension  Patient's chest discomfort likely related to supply:demand mismatch in the setting of SVT. Small cardiac biomarker leak supports this. Last trop-I returned WNL.  Will need to pursue ischemic eval in the form of a stress test tomorrow.  NRT provided for tobacco cessation.  Stressed need to establish with a PCP for continued medical care, routine health screening and monitoring pulmonary nodules demonstrated on chest CT-A.   Agree with Lopressor 47m BID for rate-control.  Switch full- to low-dose ASA.  Continue statin (even if negative stress, 10-year risk of ASCVD > 7.5% based on new guidelines).  Continue NTG SL PRN. ---I have suggested he call emts for additional episodes for adenosine. Would likely not require admission if it breaks.   Electronic Signatures: AMeriel Pica(PA-C)  (Signed 10-Mar-14 15:54)  Authored: General Aspect/Present Illness, History and Physical Exam, Review of System, Past Medical History, Health Issues, Orders, Home Medications, Labs, Radiology, Allergies, Vital Signs/Nurse's Notes, Impression/Plan GIda Rogue(MD)  (Signed 10-Mar-14 19:49)  Authored: General Aspect/Present Illness, Review of System, Labs, Radiology, Vital Signs/Nurse's Notes, Impression/Plan  Co-Signer: General Aspect/Present Illness, Home Medications, Allergies   Last Updated: 10-Mar-14 19:49 by GIda Rogue(MD)

## 2014-12-02 NOTE — H&P (Signed)
Past Med/Surgical Hx:  Hypertension:   Cholecystectomy:   Tonsillectomy:   ALLERGIES:  No Known Allergies:    Assessment/Admission Diagnosis 64 yo male with PMHx of HTN, Tobacco Abuse, with acute onset sob, chest pressure, chest pain found to be in SVT  1. new onset SVT  - currently in NSR after adenosine x 2, now with elevated CKMB - patient got lovenox and adenosine - will cont with telemetry monitoring - CE x 3 - EKG in the AM - cardiology consult in the AM for possible stress test, check echo - check CTA chest (acute sob, smoker) - cont with medical management (asa, beta blocker, statin, nitro SL, prn morphine, prn O2) - check lipid panel and Hba1c  2. HTN - BP elevated in the ED - not taken meds for the past year, previously seen by Dr. Yetta BarreJones, but stopped taking his meds - I suspect that his blood pressure has been running high for the past year and therefor will not acutely lower the blood pressure - goal BP for the next 24 hrs is 140-160 - will start metoprolol  3. Hyperglycemia - monitor, check Hba1c  4. Tobacco Abuse: counselled pt on tobacco cessation - nicotine patch  FULL CODE PMD - Dr. Yetta BarreJones  Time spent evaluating patient = 50 minutes [id number]#[id number]   Electronic Signatures: Stephanie AcreMungal, Vishal (MD)  (Signed 09-Mar-14 21:29)  Authored: PAST MEDICAL/SURGIAL HISTORY, ALLERGIES, HOME MEDICATIONS, ASSESSMENT AND PLAN   Last Updated: 09-Mar-14 21:29 by Stephanie AcreMungal, Vishal (MD)

## 2015-07-24 ENCOUNTER — Ambulatory Visit
Admission: RE | Admit: 2015-07-24 | Discharge: 2015-07-24 | Disposition: A | Discharge status: Home / Self Care | Payer: Medicare Other | Source: Ambulatory Visit | Attending: Physician Assistant | Admitting: Physician Assistant

## 2015-07-24 ENCOUNTER — Other Ambulatory Visit: Payer: Self-pay | Admitting: Physician Assistant

## 2015-07-24 DIAGNOSIS — J209 Acute bronchitis, unspecified: Secondary | ICD-10-CM | POA: Diagnosis not present

## 2015-07-24 DIAGNOSIS — R05 Cough: Secondary | ICD-10-CM

## 2015-07-24 DIAGNOSIS — R0602 Shortness of breath: Secondary | ICD-10-CM | POA: Diagnosis not present

## 2015-07-24 DIAGNOSIS — R058 Other specified cough: Secondary | ICD-10-CM

## 2015-07-26 ENCOUNTER — Emergency Department: Payer: Medicare Other

## 2015-07-26 ENCOUNTER — Encounter: Payer: Self-pay | Admitting: Emergency Medicine

## 2015-07-26 ENCOUNTER — Inpatient Hospital Stay
Admission: EM | Admit: 2015-07-26 | Discharge: 2015-08-01 | DRG: 871 | Disposition: A | Discharge status: Home Health | Payer: Medicare Other | Attending: Internal Medicine | Admitting: Internal Medicine

## 2015-07-26 DIAGNOSIS — J441 Chronic obstructive pulmonary disease with (acute) exacerbation: Secondary | ICD-10-CM

## 2015-07-26 DIAGNOSIS — I471 Supraventricular tachycardia: Secondary | ICD-10-CM | POA: Diagnosis present

## 2015-07-26 DIAGNOSIS — Z9889 Other specified postprocedural states: Secondary | ICD-10-CM | POA: Diagnosis not present

## 2015-07-26 DIAGNOSIS — J948 Other specified pleural conditions: Secondary | ICD-10-CM | POA: Diagnosis not present

## 2015-07-26 DIAGNOSIS — J918 Pleural effusion in other conditions classified elsewhere: Secondary | ICD-10-CM | POA: Diagnosis present

## 2015-07-26 DIAGNOSIS — J440 Chronic obstructive pulmonary disease with acute lower respiratory infection: Secondary | ICD-10-CM | POA: Diagnosis present

## 2015-07-26 DIAGNOSIS — I10 Essential (primary) hypertension: Secondary | ICD-10-CM | POA: Diagnosis present

## 2015-07-26 DIAGNOSIS — R0602 Shortness of breath: Secondary | ICD-10-CM | POA: Diagnosis not present

## 2015-07-26 DIAGNOSIS — J9601 Acute respiratory failure with hypoxia: Secondary | ICD-10-CM | POA: Diagnosis present

## 2015-07-26 DIAGNOSIS — J90 Pleural effusion, not elsewhere classified: Secondary | ICD-10-CM

## 2015-07-26 DIAGNOSIS — A419 Sepsis, unspecified organism: Secondary | ICD-10-CM | POA: Diagnosis not present

## 2015-07-26 DIAGNOSIS — R0902 Hypoxemia: Secondary | ICD-10-CM

## 2015-07-26 DIAGNOSIS — J189 Pneumonia, unspecified organism: Secondary | ICD-10-CM | POA: Diagnosis not present

## 2015-07-26 DIAGNOSIS — J449 Chronic obstructive pulmonary disease, unspecified: Secondary | ICD-10-CM | POA: Diagnosis not present

## 2015-07-26 DIAGNOSIS — T380X5A Adverse effect of glucocorticoids and synthetic analogues, initial encounter: Secondary | ICD-10-CM | POA: Diagnosis present

## 2015-07-26 DIAGNOSIS — Z7982 Long term (current) use of aspirin: Secondary | ICD-10-CM

## 2015-07-26 DIAGNOSIS — F17210 Nicotine dependence, cigarettes, uncomplicated: Secondary | ICD-10-CM | POA: Diagnosis not present

## 2015-07-26 DIAGNOSIS — J159 Unspecified bacterial pneumonia: Secondary | ICD-10-CM | POA: Diagnosis present

## 2015-07-26 DIAGNOSIS — Z87891 Personal history of nicotine dependence: Secondary | ICD-10-CM

## 2015-07-26 DIAGNOSIS — Z09 Encounter for follow-up examination after completed treatment for conditions other than malignant neoplasm: Secondary | ICD-10-CM | POA: Diagnosis not present

## 2015-07-26 DIAGNOSIS — R079 Chest pain, unspecified: Secondary | ICD-10-CM | POA: Diagnosis not present

## 2015-07-26 LAB — COMPREHENSIVE METABOLIC PANEL
ALT: 24 U/L (ref 17–63)
AST: 15 U/L (ref 15–41)
Albumin: 3.6 g/dL (ref 3.5–5.0)
Alkaline Phosphatase: 106 U/L (ref 38–126)
Anion gap: 8 (ref 5–15)
BUN: 17 mg/dL (ref 6–20)
CHLORIDE: 105 mmol/L (ref 101–111)
CO2: 26 mmol/L (ref 22–32)
Calcium: 9.4 mg/dL (ref 8.9–10.3)
Creatinine, Ser: 0.73 mg/dL (ref 0.61–1.24)
GFR calc Af Amer: >60 mL/min (ref >60)
Glucose, Bld: 141 mg/dL — ABNORMAL HIGH (ref 65–99)
POTASSIUM: 3.9 mmol/L (ref 3.5–5.1)
Sodium: 139 mmol/L (ref 135–145)
Total Bilirubin: 0.6 mg/dL (ref 0.3–1.2)
Total Protein: 8.3 g/dL — ABNORMAL HIGH (ref 6.5–8.1)

## 2015-07-26 LAB — CBC
HCT: 42.5 % (ref 40.0–52.0)
Hemoglobin: 14.2 g/dL (ref 13.0–18.0)
MCH: 28.7 pg (ref 26.0–34.0)
MCHC: 33.3 g/dL (ref 32.0–36.0)
MCV: 86.3 fL (ref 80.0–100.0)
PLATELETS: 294 10*3/uL (ref 150–440)
RBC: 4.93 MIL/uL (ref 4.40–5.90)
RDW: 13.7 % (ref 11.5–14.5)
WBC: 18.7 10*3/uL — AB (ref 3.8–10.6)

## 2015-07-26 LAB — TROPONIN I

## 2015-07-26 MED ORDER — SODIUM CHLORIDE 0.9 % IJ SOLN
3.0000 mL | Freq: Two times a day (BID) | INTRAMUSCULAR | Status: DC
  Administered 2015-07-26 – 2015-08-01 (×10): 3 mL via INTRAVENOUS

## 2015-07-26 MED ORDER — SODIUM CHLORIDE 0.9 % IV BOLUS (SEPSIS)
1000.0000 mL | Freq: Once | INTRAVENOUS | Status: AC
  Administered 2015-07-26: 1000 mL via INTRAVENOUS

## 2015-07-26 MED ORDER — DEXTROSE 5 % IV SOLN
1.0000 g | INTRAVENOUS | Status: DC
  Administered 2015-07-27 – 2015-08-01 (×6): 1 g via INTRAVENOUS
  Filled 2015-07-26 (×7): qty 10

## 2015-07-26 MED ORDER — OMEGA-3-ACID ETHYL ESTERS 1 G PO CAPS
1.0000 g | ORAL_CAPSULE | Freq: Every day | ORAL | Status: DC
  Administered 2015-07-26 – 2015-08-01 (×7): 1 g via ORAL
  Filled 2015-07-26 (×7): qty 1

## 2015-07-26 MED ORDER — CLONIDINE HCL 0.1 MG PO TABS
0.1000 mg | ORAL_TABLET | Freq: Two times a day (BID) | ORAL | Status: DC
  Administered 2015-07-26 – 2015-08-01 (×13): 0.1 mg via ORAL
  Filled 2015-07-26 (×13): qty 1

## 2015-07-26 MED ORDER — ASPIRIN EC 81 MG PO TBEC: 81.0000 mg | DELAYED_RELEASE_TABLET | Freq: Every day | ORAL | Status: DC

## 2015-07-26 MED ORDER — ENOXAPARIN SODIUM 40 MG/0.4ML ~~LOC~~ SOLN
40.0000 mg | SUBCUTANEOUS | Status: DC
Start: 2015-07-26 — End: 2015-07-29
  Administered 2015-07-26 – 2015-07-28 (×3): 40 mg via SUBCUTANEOUS
  Filled 2015-07-26 (×3): qty 0.4

## 2015-07-26 MED ORDER — ACETAMINOPHEN 650 MG RE SUPP: 650.0000 mg | Freq: Four times a day (QID) | RECTAL | Status: DC | PRN

## 2015-07-26 MED ORDER — DEXTROSE 5 % IV SOLN
500.0000 mg | Freq: Once | INTRAVENOUS | Status: AC
  Administered 2015-07-26: 500 mg via INTRAVENOUS
  Filled 2015-07-26: qty 500

## 2015-07-26 MED ORDER — ONDANSETRON HCL 4 MG PO TABS: 4.0000 mg | ORAL_TABLET | Freq: Four times a day (QID) | ORAL | Status: DC | PRN

## 2015-07-26 MED ORDER — ASPIRIN 81 MG PO CHEW
81.0000 mg | CHEWABLE_TABLET | Freq: Every day | ORAL | Status: DC
  Administered 2015-07-26 – 2015-08-01 (×7): 81 mg via ORAL
  Filled 2015-07-26 (×7): qty 1

## 2015-07-26 MED ORDER — HYDROCOD POLST-CPM POLST ER 10-8 MG/5ML PO SUER
5.0000 mL | Freq: Two times a day (BID) | ORAL | Status: DC
  Administered 2015-07-26 – 2015-07-31 (×6): 5 mL via ORAL
  Filled 2015-07-26 (×10): qty 5

## 2015-07-26 MED ORDER — ONDANSETRON HCL 4 MG/2ML IJ SOLN
4.0000 mg | Freq: Four times a day (QID) | INTRAMUSCULAR | Status: DC | PRN
  Administered 2015-07-27: 4 mg via INTRAVENOUS
  Filled 2015-07-26: qty 2

## 2015-07-26 MED ORDER — ACETAMINOPHEN 325 MG PO TABS
650.0000 mg | ORAL_TABLET | Freq: Four times a day (QID) | ORAL | Status: DC | PRN
Start: 2015-07-26 — End: 2015-08-01

## 2015-07-26 MED ORDER — HYDROCHLOROTHIAZIDE 25 MG PO TABS
25.0000 mg | ORAL_TABLET | Freq: Every day | ORAL | Status: DC
  Administered 2015-07-26 – 2015-08-01 (×7): 25 mg via ORAL
  Filled 2015-07-26 (×7): qty 1

## 2015-07-26 MED ORDER — DEXTROSE 5 % IV SOLN
500.0000 mg | INTRAVENOUS | Status: DC
  Administered 2015-07-26 – 2015-07-28 (×3): 500 mg via INTRAVENOUS
  Filled 2015-07-26 (×3): qty 500

## 2015-07-26 MED ORDER — ALBUTEROL SULFATE (2.5 MG/3ML) 0.083% IN NEBU
2.5000 mg | INHALATION_SOLUTION | RESPIRATORY_TRACT | Status: DC | PRN
  Administered 2015-07-26 – 2015-07-29 (×6): 2.5 mg via RESPIRATORY_TRACT
  Filled 2015-07-26 (×6): qty 3

## 2015-07-26 MED ORDER — IPRATROPIUM-ALBUTEROL 0.5-2.5 (3) MG/3ML IN SOLN
3.0000 mL | Freq: Once | RESPIRATORY_TRACT | Status: AC
  Administered 2015-07-26: 3 mL via RESPIRATORY_TRACT
  Filled 2015-07-26: qty 3

## 2015-07-26 MED ORDER — SODIUM CHLORIDE 0.9 % IV SOLN
INTRAVENOUS | Status: AC
  Administered 2015-07-26: 10:00:00 via INTRAVENOUS

## 2015-07-26 MED ORDER — HYDROCODONE-ACETAMINOPHEN 5-325 MG PO TABS
1.0000 | ORAL_TABLET | ORAL | Status: DC | PRN
  Administered 2015-07-26 – 2015-07-27 (×5): 2 via ORAL
  Filled 2015-07-26 (×6): qty 2

## 2015-07-26 MED ORDER — DEXTROSE 5 % IV SOLN
1.0000 g | Freq: Once | INTRAVENOUS | Status: AC
  Administered 2015-07-26: 1 g via INTRAVENOUS
  Filled 2015-07-26: qty 10

## 2015-07-26 NOTE — ED Notes (Signed)
Resumed care from Henry, RN. Introduced self and Mary, RN to pt.  

## 2015-07-26 NOTE — Progress Notes (Signed)
ANTIBIOTIC CONSULT NOTE - INITIAL  Pharmacy Consult for Ceftriaxone Indication: pneumonia  No Known Allergies  Patient Measurements: Height: 6\' 2"  (188 cm) Weight: 224 lb (101.606 kg) IBW/kg (Calculated) : 82.2   Vital Signs: Temp: 98.6 F (37 C) (12/14 0924) Temp Source: Oral (12/14 0924) BP: 162/70 mmHg (12/14 0924) Pulse Rate: 99 (12/14 0924) Intake/Output from previous day:   Intake/Output from this shift:    Labs:  Recent Labs  07/26/15 0456  WBC 18.7*  HGB 14.2  PLT 294  CREATININE 0.73   Estimated Creatinine Clearance: 117.2 mL/min (by C-G formula based on Cr of 0.73). No results for input(s): VANCOTROUGH, VANCOPEAK, VANCORANDOM, GENTTROUGH, GENTPEAK, GENTRANDOM, TOBRATROUGH, TOBRAPEAK, TOBRARND, AMIKACINPEAK, AMIKACINTROU, AMIKACIN in the last 72 hours.   Microbiology: No results found for this or any previous visit (from the past 720 hour(s)).  Medical History: Past Medical History  Diagnosis Date  . SVT (supraventricular tachycardia) (HCC)   . Hypertension   . Hyperglycemia   . Tobacco abuse     Medications:  Scheduled:  . aspirin  81 mg Oral Daily  . azithromycin  500 mg Intravenous Q24H  . [START ON 07/27/2015] cefTRIAXone (ROCEPHIN)  IV  1 g Intravenous Q24H  . cloNIDine  0.1 mg Oral BID  . enoxaparin (LOVENOX) injection  40 mg Subcutaneous Q24H  . hydrochlorothiazide  25 mg Oral Daily  . omega-3 acid ethyl esters  1 g Oral Daily  . sodium chloride  3 mL Intravenous Q12H   Assessment: Pharmacy consulted to dose Ceftriaxone in a 64 yo male for empiric treatment of CAP.  Patient also has orders for Azithromycin 500 mg IV q24h.    Goal of Therapy:  Resolution of infection  Plan:  Will start patient on Ceftriaxone 1 gm IV q24h.   Follow up culture results   Pharmacy will continue to follow.   Valerio Pinard G 07/26/2015,10:35 AM

## 2015-07-26 NOTE — ED Provider Notes (Signed)
[facility] Emergency Department Provider Note  ____________________________________________  Time seen: Approximately 452 AM  I have reviewed the triage vital signs and the nursing notes.   HISTORY  Chief Complaint Cough and Pleurisy    HPI Aaron Luna is a 64 y.o. male who comes into the hospital today with chest pain and shortness of breath. The patient reports that he was diagnosed with pneumonia on Monday and started on a sciatic symptoms prednisone. The patient reports that his symptoms did ease up but his breathing problems came back worse today. The patient reports that he's been coughing with a sore chest and some shortness of breath. He has been taking level floxacillin for his symptoms. He did have some fevers last week but they didn't go away. The patient's wife had been sick last week. The patient has also been using an inhaler which she reports helped some but the symptoms continue to return. He initially thought he had some SVT coming back because his chest was hurting so he decided to come in for evaluation. The patient rates his pain as a8 out of 10 in intensity.   Past Medical History  Diagnosis Date  . SVT (supraventricular tachycardia) (HCC)   . Hypertension   . Hyperglycemia   . Tobacco abuse     Patient Active Problem List   Diagnosis Date Noted  . Acute respiratory failure with hypoxia (HCC) 07/26/2015  . Pneumonia, community acquired 07/26/2015  . HTN (hypertension), benign 07/26/2015  . PSVT (paroxysmal supraventricular tachycardia) (HCC) 11/04/2012  . Essential hypertension 11/04/2012  . Tobacco abuse 11/04/2012  . Edema 11/04/2012    Past Surgical History  Procedure Laterality Date  . Tonsillectomy    . Cholecystectomy      Current Outpatient Rx  Name  Route  Sig  Dispense  Refill  . albuterol (PROVENTIL HFA;VENTOLIN HFA) 108 (90 BASE) MCG/ACT inhaler   Inhalation   Inhale 1-2 puffs into the lungs every 6 (six)  hours as needed for wheezing or shortness of breath.         Marland Kitchen aspirin 81 MG tablet   Oral   Take 81 mg by mouth daily.         . chlorpheniramine-HYDROcodone (TUSSIONEX PENNKINETIC ER) 10-8 MG/5ML SUER   Oral   Take 5 mLs by mouth every 12 (twelve) hours as needed for cough.         . cloNIDine (CATAPRES) 0.1 MG tablet   Oral   Take 1 tablet (0.1 mg total) by mouth 2 (two) times daily.   60 tablet   1     Pt needs to contact office to schedule future appo ...   . fish oil-omega-3 fatty acids 1000 MG capsule   Oral   Take 1 g by mouth daily.         Marland Kitchen levofloxacin (LEVAQUIN) 750 MG tablet   Oral   Take 750 mg by mouth daily.         Marland Kitchen PREDNISONE, PAK, PO   Oral   Take by mouth. Taper dose           Allergies Review of patient's allergies indicates no known allergies.  Family History  Problem Relation Age of Onset  . Heart disease Father 66    CABG x 4  . Hypertension Sister     Social History Social History  Substance Use Topics  . Smoking status: Former Smoker -- 1.00 packs/day    Types: Cigarettes  . Smokeless  tobacco: None  . Alcohol Use: None    Review of Systems Constitutional:  fever/chills Eyes: No visual changes. ENT: No sore throat. Cardiovascular:  chest pain. Respiratory: shortness of breath. Gastrointestinal: No abdominal pain.  No nausea, no vomiting.  No diarrhea.  No constipation. Genitourinary: Negative for dysuria. Musculoskeletal: Negative for back pain. Skin: Negative for rash. Neurological: Negative for headaches, focal weakness or numbness.  10-point ROS otherwise negative.  ____________________________________________   PHYSICAL EXAM:  VITAL SIGNS: ED Triage Vitals  Enc Vitals Group     BP 07/26/15 0451 173/160 mmHg     Pulse Rate 07/26/15 0451 110     Resp 07/26/15 0451 30     Temp 07/26/15 0451 97.7 F (36.5 C)     Temp Source 07/26/15 0451 Oral     SpO2 07/26/15 0451 88 %     Weight 07/26/15 0459 224  lb (101.606 kg)     Height 07/26/15 0459 6\' 2"  (1.88 m)     Head Cir --      Peak Flow --      Pain Score 07/26/15 0458 8     Pain Loc --      Pain Edu? --      Excl. in GC? --     Constitutional: Alert and oriented. ill appearing and in moderate distress. Eyes: Conjunctivae are normal. PERRL. EOMI. Head: Atraumatic. Nose: No congestion/rhinnorhea. Mouth/Throat: Mucous membranes are moist.  Oropharynx non-erythematous. Cardiovascular: Normal rate, regular rhythm. Grossly normal heart sounds.  Good peripheral circulation. Respiratory: increased respiratory effort.  No retractions.diminished lung sounds on the left.  Gastrointestinal: Soft and nontender. No distention. Positive bowel sounds Musculoskeletal: No lower extremity tenderness nor edema.  Neurologic:  Normal speech and language.  Skin:  Skin is warm, dry and intact.  Psychiatric: Mood and affect are normal.   ____________________________________________   LABS (all labs ordered are listed, but only abnormal results are displayed)  Labs Reviewed  CBC - Abnormal; Notable for the following:    WBC 18.7 (*)    All other components within normal limits  COMPREHENSIVE METABOLIC PANEL - Abnormal; Notable for the following:    Glucose, Bld 141 (*)    Total Protein 8.3 (*)    All other components within normal limits  CULTURE, BLOOD (ROUTINE X 2)  CULTURE, BLOOD (ROUTINE X 2)  TROPONIN I   ____________________________________________  EKG  ED ECG REPORT I, Rebecka ApleyWebster,  Allison P, the attending physician, personally viewed and interpreted this ECG.   Date: 07/26/2015  EKG Time: 459  Rate: 94  Rhythm: normal sinus rhythm  Axis: normal  Intervals:none  ST&T Change: normal  ____________________________________________  RADIOLOGY  Chest x-ray: Lungs hyperexpanded, bibasilar airspace opacities raise concern for mildly worsened pneumonia, small bilateral pleural effusion suspected, mild  cardiomegaly ____________________________________________   PROCEDURES  Procedure(s) performed: None  Critical Care performed: No  ____________________________________________   INITIAL IMPRESSION / ASSESSMENT AND PLAN / ED COURSE  Pertinent labs & imaging results that were available during my care of the patient were reviewed by me and considered in my medical decision making (see chart for details).  This is a 64 year old male recently diagnosed with pneumonia who comes in today with worsening shortness of breath and chest discomfort. According to the chest x-ray it appears as though the patient's pneumonia may be worsening. We did draw blood cultures will give the patient some antibiotics as well as a breathing treatment to determine if that might help with the symptoms. Also is  receiving a liter of normal saline.  Given the patient's hypoxia and his worsening symptoms I will admit him to the hospital for IV antibiotics for his pneumonia. I discussed this with the patient as well as with the disposition. This time the patient has no concerns and he will be admitted for his pneumonia.   _________________________________________   FINAL CLINICAL IMPRESSION(S) / ED DIAGNOSES  Final diagnoses:  Community acquired pneumonia  Hypoxia      Rebecka Apley, MD 07/26/15 [phone]

## 2015-07-26 NOTE — Progress Notes (Signed)
tussionex 5 ml oral BID for cough

## 2015-07-26 NOTE — ED Notes (Signed)
hospitalist in to see pt for admission

## 2015-07-26 NOTE — Progress Notes (Signed)
[facility] - [place] at [facility]   PATIENT NAME: Aaron Luna    MR#:  [id number]  DATE OF BIRTH:  06/04/1951  SUBJECTIVE: Admitted for bilateral pneumonia with worsening cough, pleuritic chest pain. Still having a lot of cough requesting cough syrup.   CHIEF COMPLAINT:   Chief Complaint  Patient presents with  . Cough  . Pleurisy    REVIEW OF SYSTEMS:    Review of Systems  Constitutional: Negative for fever and chills.  HENT: Negative for ear pain and hearing loss.   Eyes: Negative for blurred vision, double vision and photophobia.  Respiratory: Positive for cough and shortness of breath. Negative for hemoptysis.   Cardiovascular: Negative for palpitations, orthopnea and leg swelling.  Gastrointestinal: Negative for vomiting, abdominal pain and diarrhea.  Genitourinary: Negative for dysuria and urgency.  Musculoskeletal: Negative for myalgias and neck pain.  Skin: Negative for rash.  Neurological: Negative for dizziness, focal weakness, seizures, weakness and headaches.  Psychiatric/Behavioral: Negative for memory loss. The patient does not have insomnia.     Nutrition:  Tolerating Diet: Tolerating PT:      DRUG ALLERGIES:  No Known Allergies  VITALS:  Blood pressure 162/70, pulse 99, temperature 98.6 F (37 C), temperature source Oral, resp. rate 20, height 6\' 2"  (1.88 m), weight 101.606 kg (224 lb), SpO2 95 %.  PHYSICAL EXAMINATION:   Physical Exam  GENERAL:  64 y.o.-year-old patient lying in the bed with no acute distress.  EYES: Pupils equal, round, reactive to light and accommodation. No scleral icterus. Extraocular muscles intact.  HEENT: Head atraumatic, normocephalic. Oropharynx and nasopharynx clear.  NECK:  Supple, no jugular venous distention. No thyroid enlargement, no tenderness.  LUNGS: Diminished breath sounds bilaterally but no wheezing. CARDIOVASCULAR: S1, S2 normal. No murmurs, rubs, or gallops.  ABDOMEN: Soft,  nontender, nondistended. Bowel sounds present. No organomegaly or mass.  EXTREMITIES: No pedal edema, cyanosis, or clubbing.  NEUROLOGIC: Cranial nerves II through XII are intact. Muscle strength 5/5 in all extremities. Sensation intact. Gait not checked.  PSYCHIATRIC: The patient is alert and oriented x 3.  SKIN: No obvious rash, lesion, or ulcer.    LABORATORY PANEL:   CBC  Recent Labs Lab 07/26/15 0456  WBC 18.7*  HGB 14.2  HCT 42.5  PLT 294   ------------------------------------------------------------------------------------------------------------------  Chemistries   Recent Labs Lab 07/26/15 0456  NA 139  K 3.9  CL 105  CO2 26  GLUCOSE 141*  BUN 17  CREATININE 0.73  CALCIUM 9.4  AST 15  ALT 24  ALKPHOS 106  BILITOT 0.6   ------------------------------------------------------------------------------------------------------------------  Cardiac Enzymes  Recent Labs Lab 07/26/15 0456  TROPONINI <0.03   ------------------------------------------------------------------------------------------------------------------  RADIOLOGY:  Dg Chest 2 View  07/24/2015  CLINICAL DATA:  Shortness of breath.  Cough. EXAM: CHEST  2 VIEW COMPARISON:  CT chest 10/18/2012. FINDINGS: Mediastinum and hilar structures normal. Low lung volumes with mild bibasilar atelectasis and/or infiltrates. Component of scarring may be present. Apical pleural thickening noted consistent with scarring. No acute bony abnormality identified. Heart size normal. Degenerative changes thoracic spine . IMPRESSION: Mild bibasilar subsegmental atelectasis and/or infiltrates. Electronically Signed   By: Maisie Fushomas  Register   On: 07/24/2015 15:27   Dg Chest Portable 1 View  07/26/2015  CLINICAL DATA:  Acute onset of shortness of breath and cough. Mid chest pain. Decreased O2 saturation. Initial encounter. EXAM: PORTABLE CHEST 1 VIEW COMPARISON:  Chest radiograph performed 07/24/2015 FINDINGS: The lungs are  hypoexpanded. Bibasilar airspace opacities raise concern  for mildly worsened pneumonia. Small bilateral pleural effusions are suspected. No pneumothorax is seen. The cardiomediastinal silhouette is mildly enlarged. No acute osseous abnormalities are identified. IMPRESSION: 1. Lungs hypoexpanded. Bibasilar airspace opacities raise concern for mildly worsened pneumonia. 2. Small bilateral pleural effusions suspected. 3. Mild cardiomegaly noted. Electronically Signed   By: Roanna Raider M.D.   On: 07/26/2015 05:28     ASSESSMENT AND PLAN:   Principal Problem:   Pneumonia, community acquired Active Problems:   Essential hypertension   Acute respiratory failure with hypoxia (HCC)   HTN (hypertension), benign   #1 acute respiratory failure with hypoxia secondary to bilateral pneumonia failed outpatient therapy.; On oxygen 3 L saturating 91% continue oxygen, continue IV Rocephin, Zithromax. Continue DuoNeb's. #2 cough and pleuritic chest pain [Tussionex for the cough. #3 hypertension controlled Sepsis secondary to bilateral pneumonia: Continue IV hydration, IV antibiotics follow culture data.  giAnd DVT prophylaxis.   All the records are reviewed and case discussed with Care Management/Social Workerr. Management plans discussed with the patient, family and they are in agreement.  CODE STATUS: Full  TOTAL TIME TAKING CARE OF THIS PATIENT: 35 minutes.   POSSIBLE D/C IN 1-2DAYS, DEPENDING ON CLINICAL CONDITION.   Katha Hamming M.D on 07/26/2015 at 1:52 PM  Between 7am to 6pm - Pager - [phone]  After 6pm go to [email or web address] - password [email or web address] [facility]  [facility]  Office  [phone]  CC: Primary care physician; [facility]

## 2015-07-26 NOTE — ED Notes (Signed)
Pt to room 10 via EMS from home; reports dx with penumonia on Monday and rx prednisone & levaquin; st was feeling better then yesterday at 430pm began having return of SOB and cough with mid CP radiating around into back; EMS reports initial RA sat 88%; O2 applied at 3l/min via [place] to bring sat 92%

## 2015-07-26 NOTE — H&P (Signed)
[facility] - [place] at [facility]   PATIENT NAME: Aaron Luna    MR#:  [id number]  DATE OF BIRTH:  [birth year]  DATE OF ADMISSION:  07/26/2015  PRIMARY CARE PHYSICIAN: [facility]   REQUESTING/REFERRING PHYSICIAN: Zenda Alpers  CHIEF COMPLAINT:   Chief Complaint  Patient presents with  . Cough  . Pleurisy    HISTORY OF PRESENT ILLNESS:  Aaron Luna  is a 64 y.o. male with a known history of hypertension, paroxysmal SVT presents with the complaints of worsening cough, shortness of breath and chest discomfort on coughing. Patient gives history of cough and congestion for the past 2 weeks and he saw his primary care practitioner on Monday and was diagnosed to have pneumonia and was prescribed Levaquin and prednisone Dosepak. He felt better the following day but last night symptoms got worsened with increased cough, shortness of breath and chest discomfort, hence called EMS. EMS noted the patient with acute shortness of breath with room air O2 saturations in the upper 80s.  Evaluation in the ED revealed elevated WBC of 18.7, troponin less than 0.03. Chest x-ray bi- basilar airspace opacities and small bilateral pleural effusions, worsened compared to x-ray of 07/24/2015. EKG revealed normal sinus rhythm with ventricular rate of 94 bpm. Patient was placed on O2 supplementation following which her O2 sats improved and maintaining in the mid 90s. After obtaining blood cultures, patient was started on IV antiemetics-Rocephin and azithromycin and hospitalist service was consulted for further management. Patient does give history of fever but no chills. Cough plus with expectoration of greenish yellow sputum. Denies any nausea, vomiting, abdominal pain, diarrhea, dysuria.  PAST MEDICAL HISTORY:   Past Medical History  Diagnosis Date  . SVT (supraventricular tachycardia) (HCC)   . Hypertension   . Hyperglycemia   . Tobacco abuse     PAST SURGICAL  HISTORY:   Past Surgical History  Procedure Laterality Date  . Tonsillectomy    . Cholecystectomy      SOCIAL HISTORY:   Social History  Substance Use Topics  . Smoking status: Former Smoker -- 1.00 packs/day    Types: Cigarettes  . Smokeless tobacco: Not on file  . Alcohol Use: Not on file    FAMILY HISTORY:   Family History  Problem Relation Age of Onset  . Heart disease Father 8    CABG x 4  . Hypertension Sister     DRUG ALLERGIES:  No Known Allergies  REVIEW OF SYSTEMS:   Review of Systems  Constitutional: Positive for fever and malaise/fatigue. Negative for chills.  HENT: Negative for ear pain, hearing loss, nosebleeds, sore throat and tinnitus.   Eyes: Negative for blurred vision, double vision, pain, discharge and redness.  Respiratory: Positive for cough, sputum production and shortness of breath. Negative for hemoptysis and wheezing.   Cardiovascular: Positive for chest pain. Negative for palpitations, orthopnea and leg swelling.  Gastrointestinal: Negative for nausea, vomiting, abdominal pain, diarrhea, constipation, blood in stool and melena.  Genitourinary: Negative for dysuria, urgency, frequency and hematuria.  Musculoskeletal: Positive for back pain. Negative for joint pain and neck pain.  Skin: Negative for itching and rash.  Neurological: Negative for dizziness, tingling, sensory change, focal weakness and seizures.  Endo/Heme/Allergies: Does not bruise/bleed easily.  Psychiatric/Behavioral: Negative for depression. The patient is not nervous/anxious.     MEDICATIONS AT HOME:   Prior to Admission medications   Medication Sig Start Date End Date Taking? Authorizing Provider  aspirin 81 MG tablet Take  81 mg by mouth daily.    Historical Provider, MD  cloNIDine (CATAPRES) 0.1 MG tablet Take 1 tablet (0.1 mg total) by mouth 2 (two) times daily. 03/02/14   Antonieta Iba, MD  fish oil-omega-3 fatty acids 1000 MG capsule Take 1 g by mouth daily.     Historical Provider, MD  hydrochlorothiazide (HYDRODIURIL) 25 MG tablet Take 1 tablet (25 mg total) by mouth daily. 03/09/13   Antonieta Iba, MD      VITAL SIGNS:  Blood pressure 143/84, pulse 96, temperature 97.7 F (36.5 C), temperature source Oral, resp. rate 31, height  (1.88 m), weight 101.606 kg (224 lb), SpO2 91 %.  PHYSICAL EXAMINATION:  Physical Exam  Constitutional: He is oriented to person, place, and time. He appears well-developed and well-nourished. He appears distressed (in mild distress because of back pain and chest discomfort).  HENT:  Head: Normocephalic and atraumatic.  Right Ear: External ear normal.  Left Ear: External ear normal.  Nose: Nose normal.  Mouth/Throat: Oropharynx is clear and moist. No oropharyngeal exudate.  Eyes: EOM are normal. Pupils are equal, round, and reactive to light. No scleral icterus.  Neck: Normal range of motion. Neck supple. No JVD present. No thyromegaly present.  Cardiovascular: Normal rate, regular rhythm, normal heart sounds and intact distal pulses.  Exam reveals no friction rub.   No murmur heard. Respiratory: Effort normal. No respiratory distress. He has no wheezes. He has rales (At both bases presents). He exhibits no tenderness.  Splinting of chest wall muscles +  GI: Soft. Bowel sounds are normal. He exhibits no distension and no mass. There is no tenderness. There is no rebound and no guarding.  Musculoskeletal: Normal range of motion. He exhibits no edema.  Lymphadenopathy:    He has no cervical adenopathy.  Neurological: He is alert and oriented to person, place, and time. He has normal reflexes. He displays normal reflexes. No cranial nerve deficit. He exhibits normal muscle tone.  Skin: Skin is warm. No rash noted. No erythema.  Psychiatric: He has a normal mood and affect. His behavior is normal. Thought content normal.   LABORATORY PANEL:   CBC  Recent Labs Lab 07/26/15 0456  WBC 18.7*  HGB 14.2  HCT  42.5  PLT 294   ------------------------------------------------------------------------------------------------------------------  Chemistries   Recent Labs Lab 07/26/15 0456  NA 139  K 3.9  CL 105  CO2 26  GLUCOSE 141*  BUN 17  CREATININE 0.73  CALCIUM 9.4  AST 15  ALT 24  ALKPHOS 106  BILITOT 0.6   ------------------------------------------------------------------------------------------------------------------  Cardiac Enzymes  Recent Labs Lab 07/26/15 0456  TROPONINI <0.03   ------------------------------------------------------------------------------------------------------------------  RADIOLOGY:  Dg Chest 2 View  07/24/2015  CLINICAL DATA:  Shortness of breath.  Cough. EXAM: CHEST  2 VIEW COMPARISON:  CT chest 10/18/2012. FINDINGS: Mediastinum and hilar structures normal. Low lung volumes with mild bibasilar atelectasis and/or infiltrates. Component of scarring may be present. Apical pleural thickening noted consistent with scarring. No acute bony abnormality identified. Heart size normal. Degenerative changes thoracic spine . IMPRESSION: Mild bibasilar subsegmental atelectasis and/or infiltrates. Electronically Signed   By: Maisie Fus  Register   On: 07/24/2015 15:27   Dg Chest Portable 1 View  07/26/2015  CLINICAL DATA:  Acute onset of shortness of breath and cough. Mid chest pain. Decreased O2 saturation. Initial encounter. EXAM: PORTABLE CHEST 1 VIEW COMPARISON:  Chest radiograph performed 07/24/2015 FINDINGS: The lungs are hypoexpanded. Bibasilar airspace opacities raise concern for mildly worsened pneumonia.  Small bilateral pleural effusions are suspected. No pneumothorax is seen. The cardiomediastinal silhouette is mildly enlarged. No acute osseous abnormalities are identified. IMPRESSION: 1. Lungs hypoexpanded. Bibasilar airspace opacities raise concern for mildly worsened pneumonia. 2. Small bilateral pleural effusions suspected. 3. Mild cardiomegaly noted.  Electronically Signed   By: Roanna RaiderJeffery  Chang M.D.   On: 07/26/2015 05:28    EKG:   Orders placed or performed during the hospital encounter of 07/26/15  . ED EKG normal sinus is a with ventricular rate of 94 bpm.   . ED EKG  . EKG 12-Lead  . EKG 12-Lead    IMPRESSION AND PLAN:   64 year old male with history of hypertension, paroxysmal SVT, diagnosed with pneumonia on Monday by his PCP presents with the complaints of worsening cough, shortness of breath and chest discomfort, found to have worsening of bilateral pneumonia on repeat chest x-ray done today.  1. Bilateral pneumonia, community acquired pneumonia. Chest x-ray shows worsening infiltrates compared to the x-ray of 07/24/2015. Patient was using Levaquin for the past 2 days per his PCP. WBC elevated at 18.7. Not responding to by mouth Levaquin. 2. Acute respiratory failure with hypoxia secondary to bilateral pneumonia. Patient on O2 supplementation through nasal cannula, O2 sats improving. 3. Hypertension, stable on home medications. 4. History of paroxysmal SVT, currently his in sinus. Patient stable Plan: Admit, continue O2 supplementation, IV antiemetics-ceftriaxone and azithromycin, albuterol nebs, pain control meds. Monitor O2 sats, follow-up CBC, blood and sputum cultures. -Continue home meds for blood pressure control.     All the records are reviewed and case discussed with ED provider. Management plans discussed with the patient, family and they are in agreement.  CODE STATUS: Full code  DVT prophylaxis: Lovenox  TOTAL TIME TAKING CARE OF THIS PATIENT: 50  minutes.    Crissie FiguresEDDY, Danica Camarena N M.D on 07/26/2015 at 7:10 AM  Between 7am to 6pm - Pager - [phone]  After 6pm go to [email or web address] - password [email or web address]  [facility]  Office  [phone]  CC: Primary care physician; [facility]

## 2015-07-26 NOTE — Plan of Care (Signed)
Problem: Respiratory: Goal: Identification of resources available to assist in meeting health care needs will improve Outcome: Progressing Patient is alert and oriented. VSS. IV fluids infusing. Patient is receiving IV antibiotics.

## 2015-07-27 ENCOUNTER — Inpatient Hospital Stay: Payer: Medicare Other

## 2015-07-27 LAB — BASIC METABOLIC PANEL
ANION GAP: 8 (ref 5–15)
BUN: 15 mg/dL (ref 6–20)
CHLORIDE: 100 mmol/L — AB (ref 101–111)
CO2: 29 mmol/L (ref 22–32)
Calcium: 8.7 mg/dL — ABNORMAL LOW (ref 8.9–10.3)
Creatinine, Ser: 0.63 mg/dL (ref 0.61–1.24)
GFR calc Af Amer: >60 mL/min (ref >60)
GFR calc non Af Amer: >60 mL/min (ref >60)
GLUCOSE: 125 mg/dL — AB (ref 65–99)
POTASSIUM: 3.6 mmol/L (ref 3.5–5.1)
Sodium: 137 mmol/L (ref 135–145)

## 2015-07-27 LAB — CBC
HEMATOCRIT: 41.8 % (ref 40.0–52.0)
HEMOGLOBIN: 13.8 g/dL (ref 13.0–18.0)
MCH: 28.3 pg (ref 26.0–34.0)
MCHC: 33.2 g/dL (ref 32.0–36.0)
MCV: 85.3 fL (ref 80.0–100.0)
Platelets: 288 10*3/uL (ref 150–440)
RBC: 4.9 MIL/uL (ref 4.40–5.90)
RDW: 13.8 % (ref 11.5–14.5)
WBC: 14.9 10*3/uL — ABNORMAL HIGH (ref 3.8–10.6)

## 2015-07-27 MED ORDER — CALCIUM CARBONATE ANTACID 500 MG PO CHEW
1.0000 | CHEWABLE_TABLET | ORAL | Status: DC | PRN
  Administered 2015-07-27: 01:00:00 200 mg via ORAL
  Filled 2015-07-27: qty 1

## 2015-07-27 MED ORDER — MORPHINE SULFATE (PF) 4 MG/ML IV SOLN
4.0000 mg | INTRAVENOUS | Status: DC | PRN
  Administered 2015-07-27: 07:00:00 4 mg via INTRAVENOUS
  Filled 2015-07-27: qty 1

## 2015-07-27 MED ORDER — FAMOTIDINE 20 MG PO TABS
10.0000 mg | ORAL_TABLET | Freq: Every day | ORAL | Status: DC
  Administered 2015-07-27 – 2015-08-01 (×6): 10 mg via ORAL
  Filled 2015-07-27 (×6): qty 1

## 2015-07-27 MED ORDER — METHYLPREDNISOLONE SODIUM SUCC 125 MG IJ SOLR
60.0000 mg | INTRAMUSCULAR | Status: DC
  Administered 2015-07-27 – 2015-07-30 (×4): 60 mg via INTRAVENOUS
  Filled 2015-07-27 (×4): qty 2

## 2015-07-27 NOTE — Progress Notes (Signed)
[facility] Physicians - [place] at [facility]   PATIENT NAME: Aaron Luna    MR#:  [id number]  DATE OF BIRTH:  January 10, 1951  SUBJECTIVE: Admitted for bilateral pneumonia with worsening cough, pleuritic chest pain. His main complaint is pleuritic chest pain. Oxygen is up to 5 L. He says he feels better than yesterday. Not  able to take deep breaths pleuritic chest pain mainly in the left side.   CHIEF COMPLAINT:   Chief Complaint  Patient presents with  . Cough  . Pleurisy    REVIEW OF SYSTEMS:    Review of Systems  Constitutional: Negative for fever and chills.  HENT: Negative for ear pain and hearing loss.   Eyes: Negative for blurred vision, double vision and photophobia.  Respiratory: Positive for cough and shortness of breath. Negative for hemoptysis.   Cardiovascular: Negative for palpitations, orthopnea and leg swelling.  Gastrointestinal: Negative for vomiting, abdominal pain and diarrhea.  Genitourinary: Negative for dysuria and urgency.  Musculoskeletal: Negative for myalgias and neck pain.  Skin: Negative for rash.  Neurological: Negative for dizziness, focal weakness, seizures, weakness and headaches.  Psychiatric/Behavioral: Negative for memory loss. The patient does not have insomnia.     Nutrition:  Tolerating Diet: Tolerating PT:      DRUG ALLERGIES:  No Known Allergies  VITALS:  Blood pressure 159/79, pulse 103, temperature 98.2 F (36.8 C), temperature source Oral, resp. rate 22, height  (1.88 m), weight 99.202 kg (218 lb 11.2 oz), SpO2 92 %.  PHYSICAL EXAMINATION:   Physical Exam  GENERAL:  64 y.o.-year-old patient lying in the bed with no acute distress.  EYES: Pupils equal, round, reactive to light and accommodation. No scleral icterus. Extraocular muscles intact.  HEENT: Head atraumatic, normocephalic. Oropharynx and nasopharynx clear.  NECK:  Supple, no jugular venous distention. No thyroid enlargement, no tenderness.   LUNGS: Diminished breath sounds bilaterally but no wheezing. CARDIOVASCULAR: S1, S2 normal. No murmurs, rubs, or gallops.  ABDOMEN: Soft, nontender, nondistended. Bowel sounds present. No organomegaly or mass.  EXTREMITIES: No pedal edema, cyanosis, or clubbing.  NEUROLOGIC: Cranial nerves II through XII are intact. Muscle strength 5/5 in all extremities. Sensation intact. Gait not checked.  PSYCHIATRIC: The patient is alert and oriented x 3.  SKIN: No obvious rash, lesion, or ulcer.    LABORATORY PANEL:   CBC  Recent Labs Lab 07/27/15 0418  WBC 14.9*  HGB 13.8  HCT 41.8  PLT 288   ------------------------------------------------------------------------------------------------------------------  Chemistries   Recent Labs Lab 07/26/15 0456 07/27/15 0418  NA 139 137  K 3.9 3.6  CL 105 100*  CO2 26 29  GLUCOSE 141* 125*  BUN 17 15  CREATININE 0.73 0.63  CALCIUM 9.4 8.7*  AST 15  --   ALT 24  --   ALKPHOS 106  --   BILITOT 0.6  --    ------------------------------------------------------------------------------------------------------------------  Cardiac Enzymes  Recent Labs Lab 07/26/15 0456  TROPONINI <0.03   ------------------------------------------------------------------------------------------------------------------  RADIOLOGY:  Dg Chest Port 1 View  07/27/2015  CLINICAL DATA:  Shortness of breath EXAM: PORTABLE CHEST 1 VIEW COMPARISON:  Chest radiograph from one day prior. FINDINGS: Stable cardiomediastinal silhouette with top-normal heart size. No pneumothorax. Possible small left pleural effusion. No right pleural effusion. Low lung volumes. Mild scarring versus atelectasis at the right lung base. Patchy consolidation at the left lung base. No pulmonary edema. IMPRESSION: 1. Low lung volumes. Patchy left basilar lung consolidation, likely atelectasis, cannot exclude a component of pneumonia or  aspiration. 2. Possible small left pleural effusion. 3.  Mild scarring versus atelectasis at the right lung base. Electronically Signed   By: Delbert PhenixJason A Poff M.D.   On: 07/27/2015 11:42   Dg Chest Portable 1 View  07/26/2015  CLINICAL DATA:  Acute onset of shortness of breath and cough. Mid chest pain. Decreased O2 saturation. Initial encounter. EXAM: PORTABLE CHEST 1 VIEW COMPARISON:  Chest radiograph performed 07/24/2015 FINDINGS: The lungs are hypoexpanded. Bibasilar airspace opacities raise concern for mildly worsened pneumonia. Small bilateral pleural effusions are suspected. No pneumothorax is seen. The cardiomediastinal silhouette is mildly enlarged. No acute osseous abnormalities are identified. IMPRESSION: 1. Lungs hypoexpanded. Bibasilar airspace opacities raise concern for mildly worsened pneumonia. 2. Small bilateral pleural effusions suspected. 3. Mild cardiomegaly noted. Electronically Signed   By: Roanna RaiderJeffery  Chang M.D.   On: 07/26/2015 05:28     ASSESSMENT AND PLAN:   Principal Problem:   Pneumonia, community acquired Active Problems:   Essential hypertension   Acute respiratory failure with hypoxia (HCC)   HTN (hypertension), benign   #1 acute respiratory failure with hypoxia secondary to bilateral pneumonia failed outpatient therapy.; Continue oxygen, Solu-Medrol, IV Rocephin, Zithromax. WBC is down. Chest x-ray today did not show any worsening.  #2 cough and pleuritic chest pain [Tussionex for the cough., And  Vicodin for the pain control. #3 hypertension controlled  4. sepsis secondary to bacterial pneumonia: Present on admission without end organ damage: , blood cultures negative.     All medical records are reviewed and case discussed with Care Management/Social Workerr.Management plans discussed with the patient, family and they are in agreement.  CODE STATUS: Full  TOTAL TIME TAKING CARE OF THIS PATIENT: 35 minutes.   POSSIBLE D/C IN 1-2DAYS, DEPENDING ON CLINICAL CONDITION.   Katha HammingKONIDENA,Paylin Hailu M.D on 07/27/2015 at  11:52 AM  Between 7am to 6pm - Pager - [phone]  After 6pm go to [email or web address] - password [email or web address] [facility]  [facility] [place]  Office  [phone]  CC: Primary care physician; [facility]

## 2015-07-27 NOTE — Care Management (Signed)
Admitted to [facility] with the diagnosis of pneumonia. Lives with wife, Seward SpeckKayron [phone]). Se[facility]erv[facility] AreolaEric [facility] on Monday and was diagnosed with pneumonia. No home Health. No skilled facility. No home oxygen. Takes care of all basic and instrumental activities of daily living activities himself. Use no aids for ambulation, drives. 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 [facility]. Wife will transport. Gwenette GreetBrenda S Masami Plata RN [place] CCM Care Management [phone]

## 2015-07-27 NOTE — Progress Notes (Signed)
Pt c/o increased shortness of breath and pain. Morphine 4 mg PRN IV ordered with some improvement. Oxygen increased from 2.5 L to 3.5 L on nasal cannula.

## 2015-07-27 NOTE — Plan of Care (Signed)
Problem: Education: Goal: Knowledge of [place] [facility] Education information/materials will improve Outcome: Progressing Plan of care reviewed with pt.  Educated pt about pain rating scale and pain meds. Pt verbalized understanding.  Problem: Respiratory: Goal: Respiratory status will improve Outcome: Progressing In am noted pt with sob, wheezing, O2 sats 89% with 2L O2 per [place]. Increased O2 to 4.5L, O2 sats up to 93%. Around 1020 pt c/o sob, O2 sats were 92%, respirations 22. MD notified, chest x ray and solumedrol ordered. Currently pt on 95% with 4L O2 per [place]. Pt reported back pain, but declined an intervention.

## 2015-07-27 NOTE — Plan of Care (Signed)
Problem: Education: Goal: Knowledge of [place] [facility] Education information/materials will improve Outcome: Progressing Pt is alert and oriented, independent in room. Encouraged to call for assistance when needed. C/o pain in back, Norco give with improvement. C/o Nausea, Zofran given with improvement. C/o indigestion, Tums given with some improvement.  Continues on Nasal Cannula.

## 2015-07-27 NOTE — Clinical Documentation Improvement (Signed)
Hospitalist  Can the diagnosis of systemic infection be further specified? Please specify if Sepsis is present, and if so, was Sepsis present on admission.   Sepsis - specify causative organism if known  Determine if there is Severe Sepsis (Sepsis with organ dysfunction - specify), Septic Shock if present  Identify etiology of Sepsis - Device, Implant, Graft, Infusion, Abortion  Specify organ dysfunction - Respiratory Failure, Encephalopathy, Acute Kidney Failure, Pneumonia, UTI, Other (specify), Unable to Clinically Determine}  Other  Clinically Undetermined  Document any associated diagnoses/conditions.   Supporting Information: Sepsis secondary to bilateral pneumonia, continue IV hydration and IV antibiotics per 12/14 progress notes.   Please exercise your independent, professional judgment when responding. A specific answer is not anticipated or expected.   Thank Sabino DonovanYou,  [facility] Management [place] [phone]

## 2015-07-28 MED ORDER — AZITHROMYCIN 250 MG PO TABS
500.0000 mg | ORAL_TABLET | Freq: Every day | ORAL | Status: DC
  Administered 2015-07-29 – 2015-08-01 (×4): 500 mg via ORAL
  Filled 2015-07-28 (×4): qty 2

## 2015-07-28 MED ORDER — POLYETHYLENE GLYCOL 3350 17 G PO PACK
17.0000 g | PACK | Freq: Every day | ORAL | Status: DC
  Administered 2015-07-28 – 2015-08-01 (×3): 17 g via ORAL
  Filled 2015-07-28 (×4): qty 1

## 2015-07-28 MED ORDER — DOCUSATE SODIUM 100 MG PO CAPS
100.0000 mg | ORAL_CAPSULE | Freq: Two times a day (BID) | ORAL | Status: DC
Start: 2015-07-28 — End: 2015-08-01
  Administered 2015-07-28 – 2015-08-01 (×6): 100 mg via ORAL
  Filled 2015-07-28 (×8): qty 1

## 2015-07-28 NOTE — Care Management Important Message (Signed)
Important Message  Patient Details  Name: Aaron AriasJoseph W Luna MRN: [id number] Date of Birth: 06/25/1951   Medicare Important Message Given:  Yes    Olegario MessierKathy A Mycah Formica 07/28/2015, 9:49 AM

## 2015-07-28 NOTE — Progress Notes (Signed)
Pharmacy Antibiotic Follow-up Note- Ceftriaxone  Aaron AriasJoseph W Luna is a 64 y.o. year-old male admitted on 07/26/2015.  The patient is currently on day 3 of Ceftriaxone for CAP.  (patient also on Azithromycin)  Assessment/Plan: This patient's current antibiotics will be continued without adjustments.   (Azithromycin transitioned to PO per discussion with Aaron Luna).  Temp (24hrs), Avg:97.9 F (36.6 C), Min:97.3 F (36.3 C), Max:98.3 F (36.8 C)   Recent Labs Lab 07/26/15 0456 07/27/15 0418  WBC 18.7* 14.9*    Recent Labs Lab 07/26/15 0456 07/27/15 0418  CREATININE 0.73 0.63   Estimated Creatinine Clearance: 107 mL/min (by C-G formula based on Cr of 0.63).    No Known Allergies  Antimicrobials this admission:  Ceftriaxone  12/14 >> Azithromycin  12/14 >>  Levels/dose changes this admission:   Microbiology results: 12/14 BCx x2: NGTD   Thank you for allowing pharmacy to be a part of this patient's care.  Aaron Luna A PharmD 07/28/2015 3:31 PM

## 2015-07-28 NOTE — Plan of Care (Signed)
Problem: Respiratory: Goal: Identification of resources available to assist in meeting health care needs will improve Outcome: Progressing Patient is alert and oriented. VSS. Receiving IV antibiotics. No complaints of pain.

## 2015-07-28 NOTE — Progress Notes (Signed)
Dr Konidena-colace 100 mg oral BID, miralax oral daily

## 2015-07-28 NOTE — Progress Notes (Signed)
[facility] Physicians - [place] at [facility]   PATIENT NAME: Aaron [name]    MR#:  [id number]  DATE OF BIRTH:  12/13/1950  Admitted for bilateral pneumonia/plueritic chest pain,slightly better,continues to be hypoxic.on 3.5 litres.  CHIEF COMPLAINT:   Chief Complaint  Patient presents with  . Cough  . Pleurisy    REVIEW OF SYSTEMS:    Review of Systems  Constitutional: Negative for fever and chills.  HENT: Negative for ear pain and hearing loss.   Eyes: Negative for blurred vision, double vision and photophobia.  Respiratory: Positive for cough and shortness of breath. Negative for hemoptysis.   Cardiovascular: Negative for palpitations, orthopnea and leg swelling.  Gastrointestinal: Negative for vomiting, abdominal pain and diarrhea.  Genitourinary: Negative for dysuria and urgency.  Musculoskeletal: Negative for myalgias and neck pain.  Skin: Negative for rash.  Neurological: Negative for dizziness, focal weakness, seizures, weakness and headaches.  Psychiatric/Behavioral: Negative for memory loss. The patient does not have insomnia.     Nutrition:  Tolerating Diet: Tolerating PT:      DRUG ALLERGIES:  No Known Allergies  VITALS:  Blood pressure 135/75, pulse 105, temperature 98.3 F (36.8 C), temperature source Oral, resp. rate 28, height 6\' 2"  (1.88 m), weight 96.525 kg (212 lb 12.8 oz), SpO2 95 %.  PHYSICAL EXAMINATION:   Physical Exam  GENERAL:  64 y.o.-year-old patient lying in the bed with no acute distress.  EYES: Pupils equal, round, reactive to light and accommodation. No scleral icterus. Extraocular muscles intact.  HEENT: Head atraumatic, normocephalic. Oropharynx and nasopharynx clear.  NECK:  Supple, no jugular venous distention. No thyroid enlargement, no tenderness.  LUNGS: Diminished breath sounds bilaterally but no wheezing. CARDIOVASCULAR: S1, S2 normal. No murmurs, rubs, or gallops.  ABDOMEN: Soft, nontender, nondistended.  Bowel sounds present. No organomegaly or mass.  EXTREMITIES: No pedal edema, cyanosis, or clubbing.  NEUROLOGIC: Cranial nerves II through XII are intact. Muscle strength 5/5 in all extremities. Sensation intact. Gait not checked.  PSYCHIATRIC: The patient is alert and oriented x 3.  SKIN: No obvious rash, lesion, or ulcer.    LABORATORY PANEL:   CBC  Recent Labs Lab 07/27/15 0418  WBC 14.9*  HGB 13.8  HCT 41.8  PLT 288   ------------------------------------------------------------------------------------------------------------------  Chemistries   Recent Labs Lab 07/26/15 0456 07/27/15 0418  NA 139 137  K 3.9 3.6  CL 105 100*  CO2 26 29  GLUCOSE 141* 125*  BUN 17 15  CREATININE 0.73 0.63  CALCIUM 9.4 8.7*  AST 15  --   ALT 24  --   ALKPHOS 106  --   BILITOT 0.6  --    ------------------------------------------------------------------------------------------------------------------  Cardiac Enzymes  Recent Labs Lab 07/26/15 0456  TROPONINI <0.03   ------------------------------------------------------------------------------------------------------------------  RADIOLOGY:  Dg Chest Port 1 View  07/27/2015  CLINICAL DATA:  Shortness of breath EXAM: PORTABLE CHEST 1 VIEW COMPARISON:  Chest radiograph from one day prior. FINDINGS: Stable cardiomediastinal silhouette with top-normal heart size. No pneumothorax. Possible small left pleural effusion. No right pleural effusion. Low lung volumes. Mild scarring versus atelectasis at the right lung base. Patchy consolidation at the left lung base. No pulmonary edema. IMPRESSION: 1. Low lung volumes. Patchy left basilar lung consolidation, likely atelectasis, cannot exclude a component of pneumonia or aspiration. 2. Possible small left pleural effusion. 3. Mild scarring versus atelectasis at the right lung base. Electronically Signed   By: Delbert PhenixJason A Poff M.D.   On: 07/27/2015 11:42  ASSESSMENT AND PLAN:   Principal  Problem:   Pneumonia, community acquired Active Problems:   Essential hypertension   Acute respiratory failure with hypoxia (HCC)   HTN (hypertension), benign   #1 acute respiratory failure with hypoxia secondary to bilateral pneumonia failed outpatient therapy.; Continue oxygen, Solu-Medrol, IV Rocephin, Zithromax. WBC is down. Chest x-ray today did not show any worsening.continue present treatment,check CT chest today for persistant hypoxia  #2 cough and pleuritic chest pain [Tussionex for the cough., And  Vicodin for the pain control. #3 hypertension controlled  4. sepsis secondary to bacterial pneumonia: Present on admission without end organ damage: , blood cultures negative.     All medical records are reviewed and case discussed with Care Management/Social Workerr.Management plans discussed with the patient, family and they are in agreement.  CODE STATUS: Full  TOTAL TIME TAKING CARE OF THIS PATIENT: 35 minutes.   POSSIBLE D/C IN 1-2DAYS, DEPENDING ON CLINICAL CONDITION.   Katha Hamming M.D on 07/28/2015 at 2:16 PM  Between 7am to 6pm - Pager - [phone]  After 6pm go to [email or web address] - password [email or web address] [facility]  [place] Hospitalists  Office  [phone]  CC: Primary care physician; [facility]

## 2015-07-29 ENCOUNTER — Encounter: Payer: Self-pay | Admitting: Radiology

## 2015-07-29 ENCOUNTER — Inpatient Hospital Stay: Payer: Medicare Other

## 2015-07-29 MED ORDER — IOHEXOL 300 MG/ML  SOLN
75.0000 mL | Freq: Once | INTRAMUSCULAR | Status: AC | PRN
  Administered 2015-07-29: 75 mL via INTRAVENOUS

## 2015-07-29 NOTE — Progress Notes (Signed)
[facility] Physicians -  at [facility]   PATIENT NAME: Aaron Luna    MR#:  [id number]  DATE OF BIRTH:  1950-10-26  Admitted for bilateral pneumonia/plueritic chest pain,slightly better,continues to be hypoxic.on 3.5 litres.  CHIEF COMPLAINT:   Chief Complaint  Patient presents with  . Cough  . Pleurisy   Continues to be short of breath even at rest. Still requiring 3 L.  REVIEW OF SYSTEMS:    Review of Systems  Constitutional: Negative for fever and chills.  HENT: Negative for ear pain and hearing loss.   Eyes: Negative for blurred vision, double vision and photophobia.  Respiratory: Positive for cough and shortness of breath. Negative for hemoptysis.   Cardiovascular: Negative for palpitations, orthopnea and leg swelling.  Gastrointestinal: Negative for vomiting, abdominal pain and diarrhea.  Genitourinary: Negative for dysuria and urgency.  Musculoskeletal: Negative for myalgias and neck pain.  Skin: Negative for rash.  Neurological: Negative for dizziness, focal weakness, seizures, weakness and headaches.  Psychiatric/Behavioral: Negative for memory loss. The patient does not have insomnia.     DRUG ALLERGIES:  No Known Allergies  VITALS:  Blood pressure 155/88, pulse 86, temperature 98 F (36.7 C), temperature source Oral, resp. rate 22, height  (1.88 m), weight 96.525 kg (212 lb 12.8 oz), SpO2 96 %.  PHYSICAL EXAMINATION:   Physical Exam  GENERAL:  64 y.o.-year-old patient lying in the bed, dyspneic at rest LUNGS: Diminished breath sounds bilaterally but no wheezing. Crackles left base CARDIOVASCULAR: S1, S2 normal. No murmurs, rubs, or gallops.  ABDOMEN: Soft, nontender, nondistended. Bowel sounds present. No organomegaly or mass.  EXTREMITIES: No pedal edema, cyanosis, or clubbing. Pulses 2+ NEUROLOGIC: Cranial nerves II through XII are intact. Muscle strength 5/5 in all extremities. Sensation intact. Gait not checked.   PSYCHIATRIC: The patient is alert and oriented x 3. Anxious, tearful SKIN: No obvious rash, lesion, or ulcer.    LABORATORY PANEL:   CBC  Recent Labs Lab 07/27/15 0418  WBC 14.9*  HGB 13.8  HCT 41.8  PLT 288   ------------------------------------------------------------------------------------------------------------------  Chemistries   Recent Labs Lab 07/26/15 0456 07/27/15 0418  NA 139 137  K 3.9 3.6  CL 105 100*  CO2 26 29  GLUCOSE 141* 125*  BUN 17 15  CREATININE 0.73 0.63  CALCIUM 9.4 8.7*  AST 15  --   ALT 24  --   ALKPHOS 106  --   BILITOT 0.6  --    ------------------------------------------------------------------------------------------------------------------  Cardiac Enzymes  Recent Labs Lab 07/26/15 0456  TROPONINI <0.03   ------------------------------------------------------------------------------------------------------------------  RADIOLOGY:  Dg Chest 2 View  07/29/2015  CLINICAL DATA:  Patient with chest pain and shortness of breath. Labored breathing. EXAM: CHEST  2 VIEW COMPARISON:  Chest radiograph 07/27/2015. FINDINGS: Stable cardiac and mediastinal contours. There is a large left pleural effusion. Hazy opacification overlying the left hemi thorax. Right lung is clear. Mid thoracic spine degenerative changes. IMPRESSION: Large left pleural effusion with underlying consolidation of the left lung most compatible with associated atelectasis. Infection or underlying mass are not excluded. Continued radiographic follow-up is recommended. Electronically Signed   By: Annia Belt M.D.   On: 07/29/2015 11:33     ASSESSMENT AND PLAN:   Principal Problem:   Pneumonia, community acquired Active Problems:   Essential hypertension   Acute respiratory failure with hypoxia (HCC)   HTN (hypertension), benign   #1 acute respiratory failure with hypoxia secondary to bilateral pneumonia failed outpatient therapy: - Repeat chest  x-ray today  showing large left-sided pleural effusion, will obtain thoracentesis and studies - Continue Rocephin, Zithromax - blood cultures negative  #2 cough and pleuritic chest pain  - continue Tussionex  #3 hypertension controlled - Continue hydrochlorothiazide, clonidine  All medical records are reviewed and case discussed with Care Management/Social Workerr.Management plans discussed with the patient, family and they are in agreement.  CODE STATUS: Full  TOTAL TIME TAKING CARE OF THIS PATIENT: 35 minutes.   POSSIBLE D/C IN 1-2DAYS, DEPENDING ON CLINICAL CONDITION.   Elby ShowersWALSH, Taeko Schaffer M.D on 07/29/2015 at 1:23 PM  Between 7am to 6pm - Pager - [phone]  After 6pm go to [email or web address] - password [email or web address] [facility]  [place] [place] [facility]  Office  [phone]  CC: Primary care physician; [facility]

## 2015-07-29 NOTE — Plan of Care (Signed)
Problem: Respiratory: Goal: Identification of resources available to assist in meeting health care needs will improve Outcome: Progressing Patient is alert and oriented. VSS. No complaints of pain. Consent signed for thoracentesis tomorrow.

## 2015-07-30 ENCOUNTER — Inpatient Hospital Stay: Payer: Medicare Other

## 2015-07-30 LAB — GLUCOSE, SEROUS FLUID

## 2015-07-30 LAB — BASIC METABOLIC PANEL
Anion gap: 10 (ref 5–15)
BUN: 20 mg/dL (ref 6–20)
CHLORIDE: 95 mmol/L — AB (ref 101–111)
CO2: 34 mmol/L — AB (ref 22–32)
CREATININE: 0.65 mg/dL (ref 0.61–1.24)
Calcium: 8.7 mg/dL — ABNORMAL LOW (ref 8.9–10.3)
GFR calc Af Amer: >60 mL/min (ref >60)
GFR calc non Af Amer: >60 mL/min (ref >60)
GLUCOSE: 89 mg/dL (ref 65–99)
POTASSIUM: 3.7 mmol/L (ref 3.5–5.1)
Sodium: 139 mmol/L (ref 135–145)

## 2015-07-30 LAB — BODY FLUID CELL COUNT WITH DIFFERENTIAL
Eos, Fluid: 0 %
Lymphs, Fluid: 0 %
MONOCYTE-MACROPHAGE-SEROUS FLUID: 0 %
Neutrophil Count, Fluid: 100 %
OTHER CELLS FL: 0 %
Total Nucleated Cell Count, Fluid: 7114 cu mm

## 2015-07-30 LAB — CBC
HEMATOCRIT: 43.3 % (ref 40.0–52.0)
HEMOGLOBIN: 14.1 g/dL (ref 13.0–18.0)
MCH: 28.3 pg (ref 26.0–34.0)
MCHC: 32.7 g/dL (ref 32.0–36.0)
MCV: 86.7 fL (ref 80.0–100.0)
Platelets: 314 10*3/uL (ref 150–440)
RBC: 4.99 MIL/uL (ref 4.40–5.90)
RDW: 13.5 % (ref 11.5–14.5)
WBC: 19.2 10*3/uL — ABNORMAL HIGH (ref 3.8–10.6)

## 2015-07-30 LAB — LACTATE DEHYDROGENASE, PLEURAL OR PERITONEAL FLUID: LD, Fluid: 1710 U/L — ABNORMAL HIGH (ref 3–23)

## 2015-07-30 LAB — PROTEIN, BODY FLUID: TOTAL PROTEIN, FLUID: 4.2 g/dL

## 2015-07-30 LAB — AMYLASE: Amylase: 26 U/L — ABNORMAL LOW (ref 28–100)

## 2015-07-30 MED ORDER — PREDNISONE 50 MG PO TABS: 50.0000 mg | ORAL_TABLET | Freq: Every day | ORAL | Status: DC

## 2015-07-30 MED ORDER — PREDNISONE 20 MG PO TABS
20.0000 mg | ORAL_TABLET | Freq: Every day | ORAL | Status: DC
  Administered 2015-07-31: 20 mg via ORAL
  Filled 2015-07-30: qty 1

## 2015-07-30 NOTE — Plan of Care (Signed)
Problem: Respiratory: Goal: Respiratory status will improve Outcome: Progressing Pt's O2 had to be increased back to 4 L today, thoracentesis today with removal of 350 cc, no c/o pain this shift

## 2015-07-30 NOTE — Progress Notes (Signed)
Spoke with Dr. Clent RidgesWalsh, made her aware of pt's pleural fluid critical glucose of less than 20.  Also got a diet order from her for heart healthy.  Orson Apeanielle Knox Cervi, RN

## 2015-07-30 NOTE — Plan of Care (Signed)
Problem: Respiratory: Goal: Identification of resources available to assist in meeting health care needs will improve Outcome: Progressing Pt denies pain but still reports soreness around abdomen. Attempt to wean pt to room air during shift was unsuccessful. Pt was weaned to 2L down from 4L and is >92% on 2L. Pt was encouraged to keep oxygen on and if removed, not to do without it for long periods as pt report he has been taking oxygen off at different times. No other signs of distress noted. Will continue to monitor.

## 2015-07-30 NOTE — Progress Notes (Signed)
[facility] Physicians - [place] at [facility]   PATIENT NAME: Aaron Luna    MR#:  [id number]  DATE OF BIRTH:  04-20-1951  Admitted for bilateral pneumonia/plueritic chest pain,slightly better,continues to be hypoxic.on 3.5 litres.  CHIEF COMPLAINT:   Chief Complaint  Patient presents with  . Cough  . Pleurisy   Continues to be short of breath even at rest. Left chest discomfort. On 2L this am  REVIEW OF SYSTEMS:    Review of Systems  Constitutional: Negative for fever and chills.  HENT: Negative for ear pain and hearing loss.   Eyes: Negative for blurred vision, double vision and photophobia.  Respiratory: Positive for cough and shortness of breath. Negative for hemoptysis.   Cardiovascular: Negative for palpitations, orthopnea and leg swelling.  Gastrointestinal: Negative for vomiting, abdominal pain and diarrhea.  Genitourinary: Negative for dysuria and urgency.  Musculoskeletal: Negative for myalgias and neck pain.  Skin: Negative for rash.  Neurological: Negative for dizziness, focal weakness, seizures, weakness and headaches.  Psychiatric/Behavioral: Negative for memory loss. The patient does not have insomnia.     DRUG ALLERGIES:  No Known Allergies  VITALS:  Blood pressure 138/88, pulse 87, temperature 98.7 F (37.1 C), temperature source Oral, resp. rate 18, height  (1.88 m), weight 95.528 kg (210 lb 9.6 oz), SpO2 94 %.  PHYSICAL EXAMINATION:   Physical Exam  GENERAL:  64 y.o.-year-old patient lying in the bed, dyspneic at rest LUNGS: Diminished breath sounds bilaterally but no wheezing. Crackles left base CARDIOVASCULAR: S1, S2 normal. No murmurs, rubs, or gallops.  ABDOMEN: Soft, nontender, nondistended. Bowel sounds present. No organomegaly or mass.  EXTREMITIES: No pedal edema, cyanosis, or clubbing. Pulses 2+ NEUROLOGIC: Cranial nerves II through XII are intact. Muscle strength 5/5 in all extremities. Sensation intact. Gait not  checked.  PSYCHIATRIC: The patient is alert and oriented x 3. Anxious, tearful SKIN: No obvious rash, lesion, or ulcer.    LABORATORY PANEL:   CBC  Recent Labs Lab 07/30/15 0353  WBC 19.2*  HGB 14.1  HCT 43.3  PLT 314   ------------------------------------------------------------------------------------------------------------------  Chemistries   Recent Labs Lab 07/26/15 0456  07/30/15 0353  NA 139  < > 139  K 3.9  < > 3.7  CL 105  < > 95*  CO2 26  < > 34*  GLUCOSE 141*  < > 89  BUN 17  < > 20  CREATININE 0.73  < > 0.65  CALCIUM 9.4  < > 8.7*  AST 15  --   --   ALT 24  --   --   ALKPHOS 106  --   --   BILITOT 0.6  --   --   < > = values in this interval not displayed. ------------------------------------------------------------------------------------------------------------------  Cardiac Enzymes  Recent Labs Lab 07/26/15 0456  TROPONINI <0.03   ------------------------------------------------------------------------------------------------------------------  RADIOLOGY:  Dg Chest 1 View  07/30/2015  CLINICAL DATA:  Status post thoracentesis EXAM: CHEST 1 VIEW COMPARISON:  Yesterday FINDINGS: The left pleural effusion has improved after left thoracentesis. There is no pneumothorax. There is persistent opacity at the left base. IMPRESSION: No pneumothorax after left thoracentesis. Electronically Signed   By: Jolaine Click M.D.   On: 07/30/2015 12:31   Dg Chest 2 View  07/29/2015  CLINICAL DATA:  Patient with chest pain and shortness of breath. Labored breathing. EXAM: CHEST  2 VIEW COMPARISON:  Chest radiograph 07/27/2015. FINDINGS: Stable cardiac and mediastinal contours. There is a large left pleural effusion.  Hazy opacification overlying the left hemi thorax. Right lung is clear. Mid thoracic spine degenerative changes. IMPRESSION: Large left pleural effusion with underlying consolidation of the left lung most compatible with associated atelectasis. Infection  or underlying mass are not excluded. Continued radiographic follow-up is recommended. Electronically Signed   By: Annia Beltrew  Davis M.D.   On: 07/29/2015 11:33   Ct Chest W Contrast  07/29/2015  CLINICAL DATA:  Acute respiratory failure with hypoxia, shortness of breath, BILATERAL pneumonia with failed outpatient therapy, LEFT pleural effusion, smoking history, hypertension EXAM: CT CHEST WITH CONTRAST TECHNIQUE: Multidetector CT imaging of the chest was performed during intravenous contrast administration. CONTRAST:  75mL OMNIPAQUE IOHEXOL 300 MG/ML  SOLN COMPARISON:  10/18/2012; correlation chest radiographs 07/29/2015 FINDINGS: Minimal atherosclerotic calcification aorta. Aorta normal caliber. Minimally enlarged AP window lymph node 12 mm short axis image 24. No additional thoracic adenopathy. Visualized upper abdomen unremarkable. Large LEFT pleural effusion including a large loculated component posteriorly in the upper to mid LEFT hemi thorax. Significant atelectasis of LEFT lower lobe with partial compression of LEFT upper lobe as well. Underlying mild emphysematous changes. RIGHT lung clear. No pneumothorax. No definite pulmonary mass or nodule identified, though portions of the LEFT lung are atelectatic and suboptimally evaluated. No obvious central perihilar mass or endobronchial lesion. Slight nodular appearance to the inferior aspect of the thyroid isthmus, unable to exclude thyroid nodule 2.0 cm diameter. No acute osseous findings IMPRESSION: Large LEFT pleural effusion including a partially loculated component at the posterior aspect of the upper and mid LEFT hemi thorax. Significant atelectasis of LEFT lung greater in lower lobe. No definite mass identified. Single minimally enlarged AP window lymph node 12 mm short axis, nonspecific. Underlying COPD. Questionable thyroid nodule 2 cm diameter at inferior isthmus; followup nonemergent thyroid ultrasound recommended. Electronically Signed   By: Ulyses SouthwardMark  Boles  M.D.   On: 07/29/2015 16:38   [place] Thoracentesis Asp Pleural Space W/img Guide  07/30/2015  CLINICAL DATA:  LEFT PLEURAL EFFUSION EXAM: ULTRASOUND GUIDED LEFT THORACENTESIS COMPARISON:  None. PROCEDURE: An ultrasound guided thoracentesis was thoroughly discussed with the patient and questions answered. The benefits, risks, alternatives and complications were also discussed. The patient understands and wishes to proceed with the procedure. Written consent was obtained. Ultrasound was performed to localize and mark an adequate pocket of fluid in the left chest. The area was then prepped and draped in the normal sterile fashion. 1% Lidocaine was used for local anesthesia. Under ultrasound guidance a 19 gauge Yueh catheter was introduced. Thoracentesis was performed. The catheter was removed and a dressing applied. COMPLICATIONS: None. FINDINGS: A total of approximately 350 cc of amber fluid was removed. A fluid sample was sent for laboratory analysis. IMPRESSION: Successful ultrasound guided left thoracentesis yielding 350 cc of pleural fluid. Electronically Signed   By: Jolaine ClickArthur  Hoss M.D.   On: 07/30/2015 12:30     ASSESSMENT AND PLAN:   Principal Problem:   Pneumonia, community acquired Active Problems:   Essential hypertension   Acute respiratory failure with hypoxia (HCC)   HTN (hypertension), benign   #1 acute respiratory failure with hypoxia  - secondary to bilateral pneumonia failed outpatient therapy - continue supplemental 02 and treatment as below.  #2 community-acquired pneumonia with left-sided pleural effusion - Blood cultures negative, pleural effusion culture pending - 350 cc removed by thoracentesis on 12/18, studies pending - Continue Rocephin and azithromycin  #3 hypertension controlled - Continue hydrochlorothiazide, clonidine  #4 leukocytosis - Possibly due to steroid effect, decrease steroids  today  All medical records are reviewed and case discussed with Care  Management/Social Workerr.Management plans discussed with the patient, family and they are in agreement.  CODE STATUS: Full  TOTAL TIME TAKING CARE OF THIS PATIENT: 35 minutes.   POSSIBLE D/C IN 1-2DAYS, DEPENDING ON CLINICAL CONDITION.   Elby Showers M.D on 07/30/2015 at 2:11 PM  Between 7am to 6pm - Pager - [phone]  After 6pm go to [email or web address] - password [email or web address] [facility]  [place] [facility]  Office  [phone]  CC: Primary care physician; [facility]

## 2015-07-30 NOTE — Procedures (Signed)
L thoracentesis 350 cc No comp

## 2015-07-31 DIAGNOSIS — J189 Pneumonia, unspecified organism: Secondary | ICD-10-CM

## 2015-07-31 DIAGNOSIS — J90 Pleural effusion, not elsewhere classified: Secondary | ICD-10-CM

## 2015-07-31 LAB — CBC
HEMATOCRIT: 44.6 % (ref 40.0–52.0)
HEMOGLOBIN: 14.7 g/dL (ref 13.0–18.0)
MCH: 28.6 pg (ref 26.0–34.0)
MCHC: 33 g/dL (ref 32.0–36.0)
MCV: 86.8 fL (ref 80.0–100.0)
PLATELETS: 333 10*3/uL (ref 150–440)
RBC: 5.13 MIL/uL (ref 4.40–5.90)
RDW: 13.8 % (ref 11.5–14.5)
WBC: 19.4 10*3/uL — AB (ref 3.8–10.6)

## 2015-07-31 LAB — CULTURE, BLOOD (ROUTINE X 2)
CULTURE: NO GROWTH
Culture: NO GROWTH

## 2015-07-31 LAB — PROCALCITONIN: Procalcitonin: <0.1 ng/mL

## 2015-07-31 MED ORDER — IPRATROPIUM-ALBUTEROL 0.5-2.5 (3) MG/3ML IN SOLN
3.0000 mL | Freq: Four times a day (QID) | RESPIRATORY_TRACT | Status: DC
  Administered 2015-07-31 – 2015-08-01 (×5): 3 mL via RESPIRATORY_TRACT
  Filled 2015-07-31 (×5): qty 3

## 2015-07-31 MED ORDER — CYCLOBENZAPRINE HCL 10 MG PO TABS
10.0000 mg | ORAL_TABLET | Freq: Three times a day (TID) | ORAL | Status: DC | PRN
  Administered 2015-07-31: 10 mg via ORAL
  Filled 2015-07-31: qty 1

## 2015-07-31 NOTE — Care Management Important Message (Signed)
Important Message  Patient Details  Name: Aaron AriasJoseph W Luna MRN: [id number] Date of Birth: 09/13/1950   Medicare Important Message Given:  Yes    Olegario MessierKathy A Lilburn Straw 07/31/2015, 10:01 AM

## 2015-07-31 NOTE — Care Management Note (Signed)
Case Management Note  Patient Details  Name: Aaron Luna MRN: [id number] Date of Birth: 12/03/1950  Subjective/Objective:                  Met with patient to discuss discharge planning. He is new to O2 and would need to qualify every 48 hours for home O2 although he hopes that he will not need it when discharged. He has been independent with daily activities. He is followed at [facility] and states he has an appointment on Jan. 13, 2017. He lives with his wife who she states is able to drive him to appointment. He has not DME at home. He uses [facility] for Rx. He denies difficulty obtaining Rx.   Action/Plan:  This RNCM encouraged patient to call nurse before getting up but that ambulation would help his respiratory status. RNCM will continue to follow.   Expected Discharge Date:                  Expected Discharge Plan:     In-House Referral:     Discharge planning Services  CM Consult  Post Acute Care Choice:  Home Health, Durable Medical Equipment Choice offered to:  Patient  DME Arranged:    DME Agency:     HH Arranged:    [place] Agency:     Status of Service:  In process, will continue to follow  Medicare Important Message Given:  Yes Date Medicare IM Given:    Medicare IM give by:    Date Additional Medicare IM Given:    Additional Medicare Important Message give by:     If discussed at [place] of Stay Meetings, dates discussed:    Additional Comments:  Marshell Garfinkel, RN 07/31/2015, 11:55 AM

## 2015-07-31 NOTE — Plan of Care (Addendum)
No c/o pain, pt ambulated several times.  O2 decreased to 3L.  Repeat chest xray in the AM.

## 2015-07-31 NOTE — Consult Note (Signed)
PULMONARY CONSULT NOTE  Requesting MD/Service: [facility] Date of consult: 07/31/15 Reason for consultation: Non-resolving PNA  PT PROFILE: [age] M smoker (quit 2-3 months prior to this hospitalization) admitted 12/14 with dx of CAP. Symptoms have been slow to resolve and subsequent CXR and CT chest revealed mod-large L pleural effusion. Underwent thoracentesis 12/18 with 300 cc removed. Chemistries c/w exudative process.    HPI:  1966 M previously very healthy [facility] CAP. He was initially seen by his primary care provider 12/13 with NP cough, progressive dyspnea, subjective fevers and increasing bilateral pleurodynia. He was initiated on oral abx and prednisone. He presented to ED because of markedly worsening pleuritic CP. He has been treated since admission with ceftriaxone and azithromycin. He continued to have severe dyspnea and leukocytosis. Repeat CXR 12/17 revealed a large L pleural effusion. This was confirmed by CT chest perfromed 12/17 and an US guided thoracentesis was performed 12/18. Only 350 cc of amber fluid was removed. The chemical analysis revealed LDH 1710 an protein 4.2. Gram's stain was negative. Cultures remain negative to date. He continues to have DOE and leukocytosis. Pulmonary Medicine is asked to assist in further eval and mgmt     Past Medical History  Diagnosis Date  . SVT (supraventricular tachycardia) (HCC)   . Hypertension   . Hyperglycemia   . Tobacco abuse     Past Surgical History  Procedure Laterality Date  . Tonsillectomy    . Cholecystectomy      MEDICATIONS: I have reviewed all medications and confirmed regimen as documented  Social History   Social History  . Marital Status: Married    Spouse Name: N/A  . Number of Children: N/A  . Years of Education: N/A   Occupational History  . Not on file.   Social History Main Topics  . Smoking status: Former Smoker -- 1.00 packs/day    Types:  Cigarettes  . Smokeless tobacco: Not on file  . Alcohol Use: Not on file  . Drug Use: Not on file  . Sexual Activity: Not on file   Other Topics Concern  . Not on file   Social History Narrative    Family History  Problem Relation Age of Onset  . Heart disease Father 9056    CABG x 4  . Hypertension Sister     ROS: No myalgias/arthralgias, unexplained weight loss or weight gain No new focal weakness or sensory deficits No otalgia, hearing loss, visual changes, nasal and sinus symptoms, mouth and throat problems No neck pain or adenopathy No abdominal pain, N/V/D, diarrhea, change in bowel pattern No dysuria, change in urinary pattern No LE edema or calf tenderness   Filed Vitals:   07/31/15 0501 07/31/15 0913 07/31/15 0920 07/31/15 1230  BP:  146/89    Pulse: 76 84    Temp:      TempSrc:      Resp:      Height:      Weight: 209 lb 11.2 oz (95.119 kg)     SpO2: 93%  95% 95%     EXAM:  Gen: WDWN, No overt respiratory distress HEENT: NCAT, grossly normal Neck: Supple without LAN, thyromegaly, JVD Lungs: breath sounds markedly diminished on L approx 1/2 up with dullness to percussion in same area. There are faint crackles in LLL. No other adventitious sounds Cardiovascular: Reg rhythm, rate normal, no murmurs noted Abdomen: Soft, nontender, normal BS Ext: without clubbing, cyanosis, edema Neuro: CNs  grossly intact, motor and sensory intact, DTRs symmetric Skin: Limited exam, no lesions noted  DATA:   BMP Latest Ref Rng 07/30/2015 07/27/2015 07/26/2015  Glucose 65 - 99 mg/dL 89 161(W) 960(A)  BUN 6 - 20 mg/dL Creatinine 0.61 - 1.24 mg/dL 5.40 9.81 1.91  Sodium 135 - 145 mmol/L 139 137 139  Potassium 3.5 - 5.1 mmol/L 3.7 3.6 3.9  Chloride 101 - 111 mmol/L 95(L) 100(L) 105  CO2 22 - 32 mmol/L 34(H) 29 26  Calcium 8.9 - 10.3 mg/dL 4.7(W) 2.9(F) 9.4    CBC Latest Ref Rng 07/31/2015 07/30/2015 07/27/2015  WBC 3.8 - 10.6 K/uL 19.4(H) 19.2(H) 14.9(H)   Hemoglobin 13.0 - 18.0 g/dL 62.1 30.8 65.7  Hematocrit 40.0 - 52.0 % 44.6 43.3 41.8  Platelets 150 - 440 K/uL 333 314 288   CT chest 12/17: mod-large L effusion with compressive atx CXR (07/30/15):  Moderate opacification of L base c/w infiltrate and effusion  IMPRESSION:   1) CAP, NOS - I think this is correct diagnosis. The slow pace of recovery is likely due to para-pneumonic effusion. 2) Para-pneumonic effusion - the chemistries are c/w exudative process. The very high LDH suggests a very inflammatory reaction and also suggests a high likelihood of a complicated effusion process which might warrant more aggressive approach at drainage 3) persistent leukocytosis - again, likely due to para-pneumonic effusion but note prednisone   PLAN/REC:  1) DC prednisone 2) Continue ceftriaxone/azithromycin 3) PCT algorithm ordered 4) repeat PA/lat CXR in AM 12/20 - depending on findings, might need to consider pleural catheter or chest tube placement 5) CBC with diff in AM 12/20   Merwyn Katos, MD [facility], Critical Care Medicine

## 2015-07-31 NOTE — Progress Notes (Signed)
[facility] Physicians - [place] at [facility]   PATIENT NAME: Aaron Luna    MR#:  [id number]  DATE OF BIRTH:  1950-08-17  Admitted for bilateral pneumonia/plueritic chest pain,slightly better,continues to be hypoxic.on 3.5 litres.  CHIEF COMPLAINT:   Chief Complaint  Patient presents with  . Cough  . Pleurisy   Continues to be short of breath at rest. Still on 3-4 L.  REVIEW OF SYSTEMS:    Review of Systems  Constitutional: Negative for fever and chills.  HENT: Negative for ear pain and hearing loss.   Eyes: Negative for blurred vision, double vision and photophobia.  Respiratory: Positive for cough and shortness of breath. Negative for hemoptysis.   Cardiovascular: Negative for palpitations, orthopnea and leg swelling.  Gastrointestinal: Negative for vomiting, abdominal pain and diarrhea.  Genitourinary: Negative for dysuria and urgency.  Musculoskeletal: Negative for myalgias and neck pain.  Skin: Negative for rash.  Neurological: Negative for dizziness, focal weakness, seizures, weakness and headaches.  Psychiatric/Behavioral: Negative for memory loss. The patient does not have insomnia.     DRUG ALLERGIES:  No Known Allergies  VITALS:  Blood pressure 135/75, pulse 76, temperature 97.5 F (36.4 C), temperature source Oral, resp. rate 20, height  (1.88 m), weight 95.119 kg (209 lb 11.2 oz), SpO2 93 %.  PHYSICAL EXAMINATION:   Physical Exam  GENERAL:  64 y.o.-year-old patient lying in the bed, dyspneic at rest, able to converse LUNGS:short shallow resps, decreased breath sounds at left base, crackles and fine wheezes in left mid lung, good air movement on the right with no abnormal findings. CARDIOVASCULAR: S1, S2 normal. No murmurs, rubs, or gallops.  ABDOMEN: Soft, nontender, nondistended. Bowel sounds present. No organomegaly or mass.  EXTREMITIES: No pedal edema, cyanosis, or clubbing. Pulses 2+ NEUROLOGIC: Cranial nerves II through XII are  intact. Muscle strength 5/5 in all extremities. Sensation intact. Gait not checked.  PSYCHIATRIC: The patient is alert and oriented x 3. Anxious, tearful SKIN: No obvious rash, lesion, or ulcer.    LABORATORY PANEL:   CBC  Recent Labs Lab 07/31/15 0430  WBC 19.4*  HGB 14.7  HCT 44.6  PLT 333   ------------------------------------------------------------------------------------------------------------------  Chemistries   Recent Labs Lab 07/26/15 0456  07/30/15 0353  NA 139  < > 139  K 3.9  < > 3.7  CL 105  < > 95*  CO2 26  < > 34*  GLUCOSE 141*  < > 89  BUN 17  < > 20  CREATININE 0.73  < > 0.65  CALCIUM 9.4  < > 8.7*  AST 15  --   --   ALT 24  --   --   ALKPHOS 106  --   --   BILITOT 0.6  --   --   < > = values in this interval not displayed. ------------------------------------------------------------------------------------------------------------------  Cardiac Enzymes  Recent Labs Lab 07/26/15 0456  TROPONINI <0.03   ------------------------------------------------------------------------------------------------------------------  RADIOLOGY:  Dg Chest 1 View  07/30/2015  CLINICAL DATA:  Status post thoracentesis EXAM: CHEST 1 VIEW COMPARISON:  Yesterday FINDINGS: The left pleural effusion has improved after left thoracentesis. There is no pneumothorax. There is persistent opacity at the left base. IMPRESSION: No pneumothorax after left thoracentesis. Electronically Signed   By: Jolaine Click M.D.   On: 07/30/2015 12:31   Dg Chest 2 View  07/29/2015  CLINICAL DATA:  Patient with chest pain and shortness of breath. Labored breathing. EXAM: CHEST  2 VIEW COMPARISON:  Chest  radiograph 07/27/2015. FINDINGS: Stable cardiac and mediastinal contours. There is a large left pleural effusion. Hazy opacification overlying the left hemi thorax. Right lung is clear. Mid thoracic spine degenerative changes. IMPRESSION: Large left pleural effusion with underlying  consolidation of the left lung most compatible with associated atelectasis. Infection or underlying mass are not excluded. Continued radiographic follow-up is recommended. Electronically Signed   By: Annia Beltrew  Davis M.D.   On: 07/29/2015 11:33   Ct Chest W Contrast  07/29/2015  CLINICAL DATA:  Acute respiratory failure with hypoxia, shortness of breath, BILATERAL pneumonia with failed outpatient therapy, LEFT pleural effusion, smoking history, hypertension EXAM: CT CHEST WITH CONTRAST TECHNIQUE: Multidetector CT imaging of the chest was performed during intravenous contrast administration. CONTRAST:  75mL OMNIPAQUE IOHEXOL 300 MG/ML  SOLN COMPARISON:  10/18/2012; correlation chest radiographs 07/29/2015 FINDINGS: Minimal atherosclerotic calcification aorta. Aorta normal caliber. Minimally enlarged AP window lymph node 12 mm short axis image 24. No additional thoracic adenopathy. Visualized upper abdomen unremarkable. Large LEFT pleural effusion including a large loculated component posteriorly in the upper to mid LEFT hemi thorax. Significant atelectasis of LEFT lower lobe with partial compression of LEFT upper lobe as well. Underlying mild emphysematous changes. RIGHT lung clear. No pneumothorax. No definite pulmonary mass or nodule identified, though portions of the LEFT lung are atelectatic and suboptimally evaluated. No obvious central perihilar mass or endobronchial lesion. Slight nodular appearance to the inferior aspect of the thyroid isthmus, unable to exclude thyroid nodule 2.0 cm diameter. No acute osseous findings IMPRESSION: Large LEFT pleural effusion including a partially loculated component at the posterior aspect of the upper and mid LEFT hemi thorax. Significant atelectasis of LEFT lung greater in lower lobe. No definite mass identified. Single minimally enlarged AP window lymph node 12 mm short axis, nonspecific. Underlying COPD. Questionable thyroid nodule 2 cm diameter at inferior isthmus;  followup nonemergent thyroid ultrasound recommended. Electronically Signed   By: Ulyses SouthwardMark  Boles M.D.   On: 07/29/2015 16:38   [place] Thoracentesis Asp Pleural Space W/img Guide  07/30/2015  CLINICAL DATA:  LEFT PLEURAL EFFUSION EXAM: ULTRASOUND GUIDED LEFT THORACENTESIS COMPARISON:  None. PROCEDURE: An ultrasound guided thoracentesis was thoroughly discussed with the patient and questions answered. The benefits, risks, alternatives and complications were also discussed. The patient understands and wishes to proceed with the procedure. Written consent was obtained. Ultrasound was performed to localize and mark an adequate pocket of fluid in the left chest. The area was then prepped and draped in the normal sterile fashion. 1% Lidocaine was used for local anesthesia. Under ultrasound guidance a 19 gauge Yueh catheter was introduced. Thoracentesis was performed. The catheter was removed and a dressing applied. COMPLICATIONS: None. FINDINGS: A total of approximately 350 cc of amber fluid was removed. A fluid sample was sent for laboratory analysis. IMPRESSION: Successful ultrasound guided left thoracentesis yielding 350 cc of pleural fluid. Electronically Signed   By: Jolaine ClickArthur  Hoss M.D.   On: 07/30/2015 12:30     ASSESSMENT AND PLAN:   Principal Problem:   Pneumonia, community acquired Active Problems:   Essential hypertension   Acute respiratory failure with hypoxia (HCC)   HTN (hypertension), benign   #1 acute respiratory failure with hypoxia  - secondary to bilateral pneumonia, left sided pleural effusion and mild COPD as evidenced by mild emphasematous changes on CT - continue supplemental 02 and treatment as below. - as he has been so slow to progress will as for pulmonology consultation  #2 community-acquired pneumonia with left-sided pleural effusion -  Blood cultures negative, pleural effusion culture pending - 350 cc removed by thoracentesis on 12/18, studies pending - Continue Rocephin and  azithromycin -  Will ask thoracic surgery to eval for possible chest tube placement?  #3 hypertension controlled - Continue hydrochlorothiazide, clonidine  #4 leukocytosis - Possibly due to steroid effect, decrease steroids today  #5 COPD - has history of smoking, no prior hx COPD - continue duoneb, continue to taper steriods  All medical records are reviewed and case discussed with Care Management/Social Workerr.Management plans discussed with the patient, family and they are in agreement.  CODE STATUS: Full  TOTAL TIME TAKING CARE OF THIS PATIENT: 35 minutes.   POSSIBLE D/C IN 1-2DAYS, DEPENDING ON CLINICAL CONDITION.   Elby Showers M.D on 07/31/2015 at 8:59 AM  Between 7am to 6pm - Pager - [phone]  After 6pm go to [email or web address] - password [email or web address] [facility]  [place] [facility]  Office  [phone]  CC: Primary care physician; [facility]

## 2015-07-31 NOTE — Progress Notes (Signed)
Pt ambulated again today with 3lpm [place], sats 95%, tolerated well with some shortness of breath, left on 3lpm  sats 95% at rest

## 2015-07-31 NOTE — Progress Notes (Signed)
Pt ambulated 3 times around nurses station with 4lpm [place], sats 97%, tolerated well with some shortness of breath. Placed back on 3lpm [place] in patients room, sats 94% with 3lpm [place].

## 2015-08-01 ENCOUNTER — Inpatient Hospital Stay: Payer: Medicare Other

## 2015-08-01 DIAGNOSIS — J90 Pleural effusion, not elsewhere classified: Secondary | ICD-10-CM

## 2015-08-01 DIAGNOSIS — J948 Other specified pleural conditions: Secondary | ICD-10-CM

## 2015-08-01 LAB — CBC WITH DIFFERENTIAL/PLATELET
BASOS ABS: 0.2 10*3/uL — AB (ref 0–0.1)
Basophils Relative: 1 %
EOS PCT: 2 %
Eosinophils Absolute: 0.3 10*3/uL (ref 0–0.7)
HCT: 43.2 % (ref 40.0–52.0)
Hemoglobin: 14.1 g/dL (ref 13.0–18.0)
LYMPHS ABS: 1.9 10*3/uL (ref 1.0–3.6)
Lymphocytes Relative: 13 %
MCH: 28 pg (ref 26.0–34.0)
MCHC: 32.6 g/dL (ref 32.0–36.0)
MCV: 85.9 fL (ref 80.0–100.0)
MONOS PCT: 7 %
Monocytes Absolute: 1 10*3/uL (ref 0.2–1.0)
NEUTROS ABS: 11.5 10*3/uL — AB (ref 1.4–6.5)
NEUTROS PCT: 77 %
PLATELETS: 331 10*3/uL (ref 150–440)
RBC: 5.03 MIL/uL (ref 4.40–5.90)
RDW: 13.5 % (ref 11.5–14.5)
WBC: 14.8 10*3/uL — AB (ref 3.8–10.6)

## 2015-08-01 LAB — CYTOLOGY - NON PAP

## 2015-08-01 MED ORDER — IBUPROFEN 400 MG PO TABS: 400.0000 mg | ORAL_TABLET | Freq: Four times a day (QID) | ORAL | Status: DC | PRN

## 2015-08-01 MED ORDER — LEVOFLOXACIN 750 MG PO TABS: 750.0000 mg | ORAL_TABLET | Freq: Every day | ORAL | Status: AC

## 2015-08-01 NOTE — Progress Notes (Addendum)
SATURATION QUALIFICATIONS: (This note is used to comply with regulatory documentation for home oxygen)  Patient Saturations on Room Air at Rest =94%  Patient Saturations on Room Air while Ambulating = 88%  Patient Saturations on 3 Liters of oxygen while Ambulating = 95%  Please briefly explain why patient needs home oxygen:Chronic COPD

## 2015-08-01 NOTE — Progress Notes (Signed)
PULMONARY FOLLOW UP NOTE  Requesting MD/Service: [facility] Date of consult: 07/31/15 Reason for consultation: Non-resolving PNA  PT PROFILE: [age] M smoker (quit 2-3 months prior to this hospitalization) admitted 12/14 with dx of CAP. Symptoms have been slow to resolve and subsequent CXR and CT chest revealed mod-large L pleural effusion. Underwent thoracentesis 12/18 with 300 cc removed. Chemistries c/w exudative process.   SUBJ: No significant change. Remains afebrile. C/O vague pleuritic CP.   DATA:   BMP Latest Ref Rng 07/30/2015 07/27/2015 07/26/2015  Glucose 65 - 99 mg/dL 89 161(W125(H) 960(A141(H)  BUN 6 - 20 mg/dL 20 15 17   Creatinine 0.61 - 1.24 mg/dL 5.400.65 9.810.63 1.910.73  Sodium 135 - 145 mmol/L 139 137 139  Potassium 3.5 - 5.1 mmol/L 3.7 3.6 3.9  Chloride 101 - 111 mmol/L 95(L) 100(L) 105  CO2 22 - 32 mmol/L 34(H) 29 26  Calcium 8.9 - 10.3 mg/dL 4.7(W8.7(L) 2.9(F8.7(L) 9.4    CBC Latest Ref Rng 08/01/2015 07/31/2015 07/30/2015  WBC 3.8 - 10.6 K/uL 14.8(H) 19.4(H) 19.2(H)  Hemoglobin 13.0 - 18.0 g/dL 62.114.1 30.814.7 65.714.1  Hematocrit 40.0 - 52.0 % 43.2 44.6 43.3  Platelets 150 - 440 K/uL 331 333 314  WBC diff: 77% neut, 13% lymphocytes  CT chest (07/29/15): mod-large L effusion with compressive atx CXR (07/30/15):  Moderate opacification of L base c/w infiltrate and effusion PLEURAL FLUID (07/30/15):  LDH - 1710  Protein - 4.2  WBC - 7114, 100% neutrophils  Cytology - neg for malignancy  Culture - ngtd PCT 12/20:  < 0.10  IMPRESSION:   1) CAP, NOS c/b para-pneumonic effusion - he seems to be slowly recovering. He does not absolutely need surgical drainage @ this time but will require close follow up and monitoring of his CXR 2) Smoker - quit recently (05/2015) 3) Hypoxemia due to above - likely temporary. Will need reassessment of O2 needs when he follows up  PLAN/REC:  He very much wishes to go home which I think can be done safely now with following plan:   1) Change  ceftriaxone/azithromycin to Levofloxacin and complete 3 weeks abx 2) I instructed him to use NSAIDs liberally for chest pain 3) Please arrange home oxygen therapy to be worn @ 2-3 lpm by [place] as close to 24 hrs/day until follow up in pulmonary clinic 4) Pulmonary clinic follow up will be arranged. He will need CXR @ that time. If pleural effusion persists, will need thoracic surgery evaluation. He will also need reassessment of oxygen needs and ultimately should undergo PFTs to assess for COPD   Merwyn Katosavid B Simonds, MD [facility], Critical Care Medicine

## 2015-08-01 NOTE — Progress Notes (Signed)
Pt c/o muscle spasms in his back bilaterally.  Spoke with Dr. Allena KatzPatel and he put in order for muscle relaxer.  Orson Apeanielle Shakiya Mcneary, RN

## 2015-08-01 NOTE — Progress Notes (Signed)
Initial Nutrition Assessment   INTERVENTION:   Meals and Snacks: Cater to patient preferences Medical Food Supplement Therapy: will recommend on follow if intake poor   NUTRITION DIAGNOSIS:   No nutrition diagnosis at this time  GOAL:   Patient will meet greater than or equal to 90% of their needs  MONITOR:    (Energy Intake, Pulmonary Profile, Anthropometrics)  REASON FOR ASSESSMENT:   Malnutrition Screening Tool    ASSESSMENT:    Pt admitted with community acquired b/l pna. Per MD note pt also with left sided pleural effusions, s/p thoarcentesis 12/18 with 300mL removed.  Past Medical History  Diagnosis Date  . SVT (supraventricular tachycardia) (HCC)   . Hypertension   . Hyperglycemia   . Tobacco abuse      Diet Order:  Diet Heart Room service appropriate?: Yes; Fluid consistency:: Thin    Current Nutrition: Pt ate 100% of breakfast this am  Food/Nutrition-Related History: Pt eating >75% of meals on average and sometimes having food from the community brought it. Per MST no decrease in appetite PTA.   Scheduled Medications:  . aspirin  81 mg Oral Daily  . azithromycin  500 mg Oral Daily  . cefTRIAXone (ROCEPHIN)  IV  1 g Intravenous Q24H  . chlorpheniramine-HYDROcodone  5 mL Oral Q12H  . cloNIDine  0.1 mg Oral BID  . docusate sodium  100 mg Oral BID  . famotidine  10 mg Oral Daily  . hydrochlorothiazide  25 mg Oral Daily  . ipratropium-albuterol  3 mL Nebulization Q6H  . omega-3 acid ethyl esters  1 g Oral Daily  . polyethylene glycol  17 g Oral Daily  . sodium chloride  3 mL Intravenous Q12H     Electrolyte/Renal Profile and Glucose Profile:   Recent Labs Lab 07/26/15 0456 07/27/15 0418 07/30/15 0353  NA 139 137 139  K 3.9 3.6 3.7  CL 105 100* 95*  CO2 26 29 34*  BUN 17 15 20   CREATININE 0.73 0.63 0.65  CALCIUM 9.4 8.7* 8.7*  GLUCOSE 141* 125* 89   Protein Profile:  Recent Labs Lab 07/26/15 0456  ALBUMIN 3.6    Gastrointestinal  Profile: Last BM:  07/30/2015   Weight Change: No weight decrease PTA per MST.    Height:   Ht Readings from Last 1 Encounters:  07/26/15 6\' 2"  (1.88 m)    Weight:   Wt Readings from Last 1 Encounters:  08/01/15 208 lb 3.2 oz (94.439 kg)     BMI:  Body mass index is 26.72 kg/(m^2).  EDUCATION NEEDS:   No education needs identified at this time   LOW Care Level  Leda QuailAllyson Jadriel Saxer, RD, LDN Pager [phone] Weekend/On-Call Pager [phone]

## 2015-08-01 NOTE — Discharge Instructions (Signed)
You should use ibuprofen as prescribed for pain and to decrease inflammation  DIET:  Regular diet  DISCHARGE CONDITION:  Fair  ACTIVITY:  Activity as tolerated  OXYGEN:  Home Oxygen: Yes.     Oxygen Delivery: 2-3 liters/min via Patient connected to nasal cannula oxygen  DISCHARGE LOCATION:  home   If you experience worsening of your admission symptoms, develop shortness of breath, life threatening emergency, suicidal or homicidal thoughts you must seek medical attention immediately by calling 911 or calling your MD immediately  if symptoms less severe.  You Must read complete instructions/literature along with all the possible adverse reactions/side effects for all the Medicines you take and that have been prescribed to you. Take any new Medicines after you have completely understood and accpet all the possible adverse reactions/side effects.   Please note  You were cared for by a hospitalist during your hospital stay. If you have any questions about your discharge medications or the care you received while you were in the hospital after you are discharged, you can call the unit and asked to speak with the hospitalist on call if the hospitalist that took care of you is not available. Once you are discharged, your primary care physician will handle any further medical issues. Please note that NO REFILLS for any discharge medications will be authorized once you are discharged, as it is imperative that you return to your primary care physician (or establish a relationship with a primary care physician if you do not have one) for your aftercare needs so that they can reassess your need for medications and monitor your lab values.

## 2015-08-01 NOTE — Discharge Summary (Signed)
[facility] [place] [facility]  DISCHARGE SUMMARY   PATIENT NAME: Aaron Luna    MR#:  [id number]  DATE OF BIRTH:  03/19/1951  DATE OF ADMISSION:  07/26/2015 ADMITTING PHYSICIAN: Crissie FiguresEdavally N Reddy, MD  DATE OF DISCHARGE: 08/01/2015  PRIMARY CARE PHYSICIAN: [facility]    ADMISSION DIAGNOSIS:  Community acquired pneumonia [J18.9] Hypoxia [R09.02]  DISCHARGE DIAGNOSIS:  Principal Problem:   Pneumonia, community acquired Active Problems:   Essential hypertension   Acute respiratory failure with hypoxia (HCC)   HTN (hypertension), benign   Pleural effusion   SECONDARY DIAGNOSIS:   Past Medical History  Diagnosis Date  . SVT (supraventricular tachycardia) (HCC)   . Hypertension   . Hyperglycemia   . Tobacco abuse     HOSPITAL COURSE:   #1 acute respiratory failure with hypoxia : Secondary to bilateral pneumonia, left sided pleural effusion and mild COPD as evidenced by mild emphasematous changes on CT. Continue supplemental 02 on discharge 2-3 L and treatment as below. He will follow-up with Dr. Sung AmabileSimonds in pulmonology clinic in 1-2 weeks after discharge. He will need formal PFTs once this pneumonia has resolved.   #2 community-acquired pneumonia with left-sided pleural effusion: Blood cultures negative. Thoracentesis on 12/8 with removal of 350 cc fluid showing high white blood cell count, culture negative. He was changed with Rocephin and azithromycin during hospitalization. Will be discharged on Levaquin to complete a three-week course of antibiotics.  #3 hypertension controlled. Continue hydrochlorothiazide, clonidine  #4 COPD: Needs PFTs. Diagnosis based on history of smoking and chest x-ray findings.   DISCHARGE CONDITIONS:   Fair  CONSULTS OBTAINED:  Treatment Team:  Merwyn Katosavid B Simonds, MD  DRUG ALLERGIES:  No Known Allergies  DISCHARGE MEDICATIONS:   Current Discharge Medication List    START taking  these medications   Details  ibuprofen (ADVIL,MOTRIN) 400 MG tablet Take 1 tablet (400 mg total) by mouth every 6 (six) hours as needed. Qty: 30 tablet, Refills: 0      CONTINUE these medications which have CHANGED   Details  levofloxacin (LEVAQUIN) 750 MG tablet Take 1 tablet (750 mg total) by mouth daily. Qty: 14 tablet, Refills: 0      CONTINUE these medications which have NOT CHANGED   Details  albuterol (PROVENTIL HFA;VENTOLIN HFA) 108 (90 BASE) MCG/ACT inhaler Inhale 1-2 puffs into the lungs every 6 (six) hours as needed for wheezing or shortness of breath.    aspirin 81 MG tablet Take 81 mg by mouth daily.    chlorpheniramine-HYDROcodone (TUSSIONEX PENNKINETIC ER) 10-8 MG/5ML SUER Take 5 mLs by mouth every 12 (twelve) hours as needed for cough.    cloNIDine (CATAPRES) 0.1 MG tablet Take 1 tablet (0.1 mg total) by mouth 2 (two) times daily. Qty: 60 tablet, Refills: 1   Associated Diagnoses: Unspecified essential hypertension    fish oil-omega-3 fatty acids 1000 MG capsule Take 1 g by mouth daily.      STOP taking these medications     PREDNISONE, PAK, PO      hydrochlorothiazide (HYDRODIURIL) 25 MG tablet          DISCHARGE INSTRUCTIONS:    Fair condition. Needs supplemental oxygen with home health. Regular diet.  If you experience worsening of your admission symptoms, develop shortness of breath, life threatening emergency, suicidal or homicidal thoughts you must seek medical attention immediately by calling 911 or calling your MD immediately  if symptoms less severe.  You Must read complete instructions/literature along  with all the possible adverse reactions/side effects for all the Medicines you take and that have been prescribed to you. Take any new Medicines after you have completely understood and accept all the possible adverse reactions/side effects.   Please note  You were cared for by a hospitalist during your hospital stay. If you have any questions  about your discharge medications or the care you received while you were in the hospital after you are discharged, you can call the unit and asked to speak with the hospitalist on call if the hospitalist that took care of you is not available. Once you are discharged, your primary care physician will handle any further medical issues. Please note that NO REFILLS for any discharge medications will be authorized once you are discharged, as it is imperative that you return to your primary care physician (or establish a relationship with a primary care physician if you do not have one) for your aftercare needs so that they can reassess your need for medications and monitor your lab values.    Today   CHIEF COMPLAINT:   Chief Complaint  Patient presents with  . Cough  . Pleurisy    HISTORY OF PRESENT ILLNESS:  Aaron Luna is a 64 y.o. male with a known history of hypertension, paroxysmal SVT presents with the complaints of worsening cough, shortness of breath and chest discomfort on coughing. Patient gives history of cough and congestion for the past 2 weeks and he saw his primary care practitioner on Monday and was diagnosed to have pneumonia and was prescribed Levaquin and prednisone Dosepak. He felt better the following day but last night symptoms got worsened with increased cough, shortness of breath and chest discomfort, hence called EMS. EMS noted the patient with acute shortness of breath with room air O2 saturations in the upper 80s.  Evaluation in the ED revealed elevated WBC of 18.7, troponin less than 0.03. Chest x-ray bi- basilar airspace opacities and small bilateral pleural effusions, worsened compared to x-ray of 07/24/2015. EKG revealed normal sinus rhythm with ventricular rate of 94 bpm. Patient was placed on O2 supplementation following which her O2 sats improved and maintaining in the mid 90s. After obtaining blood cultures, patient was started on IV antiemetics-Rocephin and  azithromycin and hospitalist service was consulted for further management. Patient does give history of fever but no chills. Cough plus with expectoration of greenish yellow sputum. Denies any nausea, vomiting, abdominal pain, diarrhea, dysuria.   VITAL SIGNS:  Blood pressure 122/72, pulse 82, temperature 97.7 F (36.5 C), temperature source Oral, resp. rate 20, height  (1.88 m), weight 94.439 kg (208 lb 3.2 oz), SpO2 93 %.  I/O:   Intake/Output Summary (Last 24 hours) at 08/01/15 1430 Last data filed at 08/01/15 1610  Gross per 24 hour  Intake    480 ml  Output      0 ml  Net    480 ml    PHYSICAL EXAMINATION:  GENERAL:  64 y.o.-year-old patient lying in the bed, somewhat uncomfortable LUNGS: Short shallow respirations, decreased breath sounds on the left base with crackles at the mid lung fields  CARDIOVASCULAR: S1, S2 normal. No murmurs, rubs, or gallops.  ABDOMEN: Soft, non-tender, non-distended. Bowel sounds present. No organomegaly or mass.  EXTREMITIES: No pedal edema, cyanosis, or clubbing.  NEUROLOGIC: Cranial nerves II through XII are intact. Muscle strength 5/5 in all extremities. Sensation intact. Gait not checked.  PSYCHIATRIC: The patient is alert and oriented x 3.  SKIN: No  obvious rash, lesion, or ulcer.   DATA REVIEW:   CBC  Recent Labs Lab 08/01/15 0457  WBC 14.8*  HGB 14.1  HCT 43.2  PLT 331    Chemistries   Recent Labs Lab 07/26/15 0456  07/30/15 0353  NA 139  < > 139  K 3.9  < > 3.7  CL 105  < > 95*  CO2 26  < > 34*  GLUCOSE 141*  < > 89  BUN 17  < > 20  CREATININE 0.73  < > 0.65  CALCIUM 9.4  < > 8.7*  AST 15  --   --   ALT 24  --   --   ALKPHOS 106  --   --   BILITOT 0.6  --   --   < > = values in this interval not displayed.  Cardiac Enzymes  Recent Labs Lab 07/26/15 0456  TROPONINI <0.03    Microbiology Results  Results for orders placed or performed during the hospital encounter of 07/26/15  Blood culture (routine x  2)     Status: None   Collection Time: 07/26/15  5:28 AM  Result Value Ref Range Status   Specimen Description BLOOD LEFT HAND  Final   Special Requests   Final    BOTTLES DRAWN AEROBIC AND ANAEROBIC 10MLANAEROBIC,13MLAEROBIC   Culture NO GROWTH 5 DAYS  Final   Report Status 07/31/2015 FINAL  Final  Blood culture (routine x 2)     Status: None   Collection Time: 07/26/15  5:29 AM  Result Value Ref Range Status   Specimen Description BLOOD BLOOD LEFT FOREARM  Final   Special Requests BOTTLES DRAWN AEROBIC AND ANAEROBIC  Final   Culture NO GROWTH 5 DAYS  Final   Report Status 07/31/2015 FINAL  Final  Body fluid culture     Status: None (Preliminary result)   Collection Time: 07/30/15 11:35 AM  Result Value Ref Range Status   Specimen Description PLEURAL  Final   Special Requests NONE  Final   Gram Stain MANY WBC SEEN NO ORGANISMS SEEN   Final   Culture NO GROWTH 2 DAYS  Final   Report Status PENDING  Incomplete    RADIOLOGY:  Dg Chest 2 View  08/01/2015  CLINICAL DATA:  Shortness of breath. EXAM: CHEST  2 VIEW COMPARISON:  07/30/2015 .  07/29/2015.  CT 07/29/2015. FINDINGS: Mediastinum and hilar structures normal. Left lower lobe infiltrate and left pleural effusion again noted. No pneumothorax. Heart size normal. Degenerative changes thoracic spine. IMPRESSION: 1. Left lower lobe atelectasis and/or infiltrate and left pleural effusion again noted. No significant interim change. 2. Mild right base subsegmental atelectasis. Electronically Signed   By: Maisie Fus  Register   On: 08/01/2015 07:58    EKG:   Orders placed or performed during the hospital encounter of 07/26/15  . ED EKG  . ED EKG  . EKG 12-Lead  . EKG 12-Lead      Management plans discussed with the patient, family and they are in agreement.  CODE STATUS:     Code Status Orders        Start     Ordered   07/26/15 1009  Full code   Continuous     07/26/15 1008      TOTAL TIME TAKING CARE OF THIS  PATIENT: 35 minutes.  Greater than 50% of time spent in care coordination and counseling.  Elby Showers M.D on 08/01/2015 at 2:30 PM  Between 7am to  6pm - Pager - [phone]  After 6pm go to [email or web address] - password [email or web address] [facility]  [place] [facility]  Office  [phone]  CC: Primary care physician; [facility]

## 2015-08-01 NOTE — Plan of Care (Signed)
Problem: Education: Goal: Knowledge of [place] [facility] Education information/materials will improve Outcome: Progressing Oriented to unit  Problem: Respiratory: Goal: Respiratory status will improve Outcome: Progressing Pt on room air, breathing is improving Goal: Identification of resources available to assist in meeting health care needs will improve Outcome: Not Met (add Reason) Pt sleeping most of the night

## 2015-08-01 NOTE — Care Management (Signed)
Home O2 set up with [facility] pending O2 assessment by RN. I spoke with patient and he would like to use [facility] for home health. Barbara CowerJason with [facility] notified of this need.

## 2015-08-01 NOTE — Progress Notes (Signed)
Discussed discharge instructions and medication with pt and his wife.  IV removed.  RX given. No questions at this time.  Orson Apeanielle Danel Studzinski, RN

## 2015-08-02 ENCOUNTER — Other Ambulatory Visit: Payer: Self-pay | Admitting: Pulmonary Disease

## 2015-08-02 DIAGNOSIS — J189 Pneumonia, unspecified organism: Secondary | ICD-10-CM

## 2015-08-03 LAB — BODY FLUID CULTURE: Culture: NO GROWTH

## 2015-08-05 DIAGNOSIS — Z87891 Personal history of nicotine dependence: Secondary | ICD-10-CM | POA: Diagnosis not present

## 2015-08-05 DIAGNOSIS — J440 Chronic obstructive pulmonary disease with acute lower respiratory infection: Secondary | ICD-10-CM | POA: Diagnosis not present

## 2015-08-05 DIAGNOSIS — J189 Pneumonia, unspecified organism: Secondary | ICD-10-CM | POA: Diagnosis not present

## 2015-08-05 DIAGNOSIS — I471 Supraventricular tachycardia: Secondary | ICD-10-CM | POA: Diagnosis not present

## 2015-08-05 DIAGNOSIS — I10 Essential (primary) hypertension: Secondary | ICD-10-CM | POA: Diagnosis not present

## 2015-08-05 DIAGNOSIS — Z9181 History of falling: Secondary | ICD-10-CM | POA: Diagnosis not present

## 2015-08-05 DIAGNOSIS — Z9981 Dependence on supplemental oxygen: Secondary | ICD-10-CM | POA: Diagnosis not present

## 2015-08-07 DIAGNOSIS — I471 Supraventricular tachycardia: Secondary | ICD-10-CM | POA: Diagnosis not present

## 2015-08-07 DIAGNOSIS — J189 Pneumonia, unspecified organism: Secondary | ICD-10-CM | POA: Diagnosis not present

## 2015-08-07 DIAGNOSIS — I10 Essential (primary) hypertension: Secondary | ICD-10-CM | POA: Diagnosis not present

## 2015-08-07 DIAGNOSIS — Z9981 Dependence on supplemental oxygen: Secondary | ICD-10-CM | POA: Diagnosis not present

## 2015-08-07 DIAGNOSIS — Z87891 Personal history of nicotine dependence: Secondary | ICD-10-CM | POA: Diagnosis not present

## 2015-08-07 DIAGNOSIS — J440 Chronic obstructive pulmonary disease with acute lower respiratory infection: Secondary | ICD-10-CM | POA: Diagnosis not present

## 2015-08-09 DIAGNOSIS — I471 Supraventricular tachycardia: Secondary | ICD-10-CM | POA: Diagnosis not present

## 2015-08-09 DIAGNOSIS — Z87891 Personal history of nicotine dependence: Secondary | ICD-10-CM | POA: Diagnosis not present

## 2015-08-09 DIAGNOSIS — Z9981 Dependence on supplemental oxygen: Secondary | ICD-10-CM | POA: Diagnosis not present

## 2015-08-09 DIAGNOSIS — I10 Essential (primary) hypertension: Secondary | ICD-10-CM | POA: Diagnosis not present

## 2015-08-09 DIAGNOSIS — J440 Chronic obstructive pulmonary disease with acute lower respiratory infection: Secondary | ICD-10-CM | POA: Diagnosis not present

## 2015-08-09 DIAGNOSIS — J189 Pneumonia, unspecified organism: Secondary | ICD-10-CM | POA: Diagnosis not present

## 2015-08-11 DIAGNOSIS — I10 Essential (primary) hypertension: Secondary | ICD-10-CM | POA: Diagnosis not present

## 2015-08-11 DIAGNOSIS — Z87891 Personal history of nicotine dependence: Secondary | ICD-10-CM | POA: Diagnosis not present

## 2015-08-11 DIAGNOSIS — J440 Chronic obstructive pulmonary disease with acute lower respiratory infection: Secondary | ICD-10-CM | POA: Diagnosis not present

## 2015-08-11 DIAGNOSIS — J189 Pneumonia, unspecified organism: Secondary | ICD-10-CM | POA: Diagnosis not present

## 2015-08-11 DIAGNOSIS — I471 Supraventricular tachycardia: Secondary | ICD-10-CM | POA: Diagnosis not present

## 2015-08-11 DIAGNOSIS — Z9981 Dependence on supplemental oxygen: Secondary | ICD-10-CM | POA: Diagnosis not present

## 2015-08-28 ENCOUNTER — Ambulatory Visit (INDEPENDENT_AMBULATORY_CARE_PROVIDER_SITE_OTHER): Payer: Medicare Other | Admitting: Pulmonary Disease

## 2015-08-28 ENCOUNTER — Ambulatory Visit
Admission: RE | Admit: 2015-08-28 | Discharge: 2015-08-28 | Disposition: A | Discharge status: Home / Self Care | Payer: Medicare Other | Source: Ambulatory Visit | Attending: Pulmonary Disease | Admitting: Pulmonary Disease

## 2015-08-28 ENCOUNTER — Encounter: Payer: Self-pay | Admitting: Pulmonary Disease

## 2015-08-28 VITALS — BP 160/88 | HR 86 | Ht 74.0 in | Wt 214.0 lb

## 2015-08-28 DIAGNOSIS — J189 Pneumonia, unspecified organism: Secondary | ICD-10-CM

## 2015-08-28 DIAGNOSIS — Z87891 Personal history of nicotine dependence: Secondary | ICD-10-CM

## 2015-08-28 DIAGNOSIS — J181 Lobar pneumonia, unspecified organism: Principal | ICD-10-CM

## 2015-08-28 DIAGNOSIS — J918 Pleural effusion in other conditions classified elsewhere: Secondary | ICD-10-CM

## 2015-08-28 DIAGNOSIS — J948 Other specified pleural conditions: Secondary | ICD-10-CM | POA: Diagnosis not present

## 2015-08-28 DIAGNOSIS — J90 Pleural effusion, not elsewhere classified: Secondary | ICD-10-CM | POA: Insufficient documentation

## 2015-08-28 NOTE — Patient Instructions (Signed)
Continue ibuprofen as needed for chest discomfort Follow up in 6-8 weeks with CXR @ that time

## 2015-08-30 NOTE — Progress Notes (Signed)
PULM OFFICE POST HOSP VISIT  SYNOPSIS: 65 y.o. M hospitalized 12/14 - 08/01/15 with L sided PNA and parapneumonic effusion. Underwent thoracentesis 12/18 with 350 cc amber fluid removed. LDH was 1710. Seen in consultation by Pulmonary medicine 12/19 and indicated that he was feeling better. By 12/20 he very much wanted to go home. My concern was that he still had substantial L pleural effusion but he was afebrile and WBC was improving. His PCT was normal. He was discharged with the following plan: 1) Change ceftriaxone/azithromycin to Levofloxacin and complete 3 weeks abx 2) I instructed him to use NSAIDs liberally for chest pain 3) Please arrange home oxygen therapy to be worn @ 2-3 lpm by [place] as close to 24 hrs/day until follow up in pulmonary clinic 4) Pulmonary clinic follow up will be arranged. He will need CXR @ that time. If pleural effusion persists, will need thoracic surgery evaluation. He will also need reassessment of oxygen needs and ultimately should undergo PFTs to assess for COPD  SUBJ: Since discharge he has improved very gradually noting that last week he really started to feel that he was turning the corner. He has purchased a portable oximeter and notes that his SpO2 drops no lower than 88% with ambulation. He continues to wear supplemental oxygen most of the day. He has persistent L sided chest ache that he believes is relieved by oxygen and also by NSAIDS. He has no fever or productive cough. He has not smoked since his admission.   OBJ: Filed Vitals:   08/28/15 1133  BP: 160/88  Pulse: 86  Height:  (1.88 m)  Weight: 214 lb (97.07 kg)  SpO2: 97%   Ambulatory RA SpO2 94%  NAD HEENT WNL No JVD Diminished BS in lower half of L lung, no wheezes RRR s M NABS, soft No C/C/E  CXR (08/28/15): Persistent L pleural opacity c/w pleural effusion  IMPRESSION: 1) Recent CAP with parapneumonic effusion 2) Persistent pleural opacification/effusion 3) Smoker - abstinent  since 07/26/15 4) Hypoxemia - improving  Clinically he is stable to improving. He might still require surgical intervention as he remains symptomatic but there is no urgency to pursue this presently  PLAN: 1) congratulated him on success on smoking cessation 2) we discussed options of mgmt and I recommended continued surveillance 3) continue NSAIDs as needed for chest discomfort 4) continue O2 @ night and as needed during day for dyspnea 5) ROV 6-8 wks with repeat CXR @ that time. Consider repeat CT chest and surgical eval if pleural process is not resolving radiographically and if he continues to have pleurodynia  Billy Fischer, MD PCCM service Mobile [phone] Pager [phone]

## 2015-09-05 ENCOUNTER — Inpatient Hospital Stay: Payer: Medicare Other | Admitting: Pulmonary Disease

## 2015-09-12 ENCOUNTER — Telehealth: Payer: Self-pay | Admitting: *Deleted

## 2015-09-12 DIAGNOSIS — J9601 Acute respiratory failure with hypoxia: Secondary | ICD-10-CM

## 2015-09-12 NOTE — Telephone Encounter (Signed)
Pt calling stating he was told to call us and let him know when he wanted to go off the oxygen  He is ready to do so Just needs [place] to put in an order to do so States he is already off it for a week now.  Please advise.

## 2015-09-12 NOTE — Telephone Encounter (Signed)
Order placed for O2 to be picked up. Nothing further needed.

## 2015-09-16 ENCOUNTER — Emergency Department: Payer: Medicare Other

## 2015-09-16 ENCOUNTER — Emergency Department
Admission: EM | Admit: 2015-09-16 | Discharge: 2015-09-17 | Disposition: A | Discharge status: Home / Self Care | Payer: Medicare Other | Attending: Emergency Medicine | Admitting: Emergency Medicine

## 2015-09-16 ENCOUNTER — Encounter: Payer: Self-pay | Admitting: *Deleted

## 2015-09-16 DIAGNOSIS — R079 Chest pain, unspecified: Secondary | ICD-10-CM

## 2015-09-16 DIAGNOSIS — Z7982 Long term (current) use of aspirin: Secondary | ICD-10-CM | POA: Diagnosis not present

## 2015-09-16 DIAGNOSIS — I10 Essential (primary) hypertension: Secondary | ICD-10-CM | POA: Insufficient documentation

## 2015-09-16 DIAGNOSIS — R0789 Other chest pain: Secondary | ICD-10-CM | POA: Insufficient documentation

## 2015-09-16 DIAGNOSIS — Z87891 Personal history of nicotine dependence: Secondary | ICD-10-CM | POA: Diagnosis not present

## 2015-09-16 DIAGNOSIS — Z79899 Other long term (current) drug therapy: Secondary | ICD-10-CM | POA: Diagnosis not present

## 2015-09-16 DIAGNOSIS — I471 Supraventricular tachycardia: Secondary | ICD-10-CM | POA: Diagnosis not present

## 2015-09-16 LAB — BASIC METABOLIC PANEL
ANION GAP: 6 (ref 5–15)
BUN: 14 mg/dL (ref 6–20)
CHLORIDE: 103 mmol/L (ref 101–111)
CO2: 31 mmol/L (ref 22–32)
Calcium: 9 mg/dL (ref 8.9–10.3)
Creatinine, Ser: 0.85 mg/dL (ref 0.61–1.24)
GFR calc non Af Amer: >60 mL/min (ref >60)
Glucose, Bld: 121 mg/dL — ABNORMAL HIGH (ref 65–99)
POTASSIUM: 3.8 mmol/L (ref 3.5–5.1)
Sodium: 140 mmol/L (ref 135–145)

## 2015-09-16 LAB — CBC
HEMATOCRIT: 40.4 % (ref 40.0–52.0)
Hemoglobin: 13.4 g/dL (ref 13.0–18.0)
MCH: 28.1 pg (ref 26.0–34.0)
MCHC: 33.3 g/dL (ref 32.0–36.0)
MCV: 84.6 fL (ref 80.0–100.0)
Platelets: 230 10*3/uL (ref 150–440)
RBC: 4.77 MIL/uL (ref 4.40–5.90)
RDW: 16.2 % — ABNORMAL HIGH (ref 11.5–14.5)
WBC: 7.2 10*3/uL (ref 3.8–10.6)

## 2015-09-16 LAB — TROPONIN I: Troponin I: <0.03 ng/mL (ref <0.031)

## 2015-09-16 MED ORDER — LORAZEPAM 2 MG/ML IJ SOLN
1.0000 mg | Freq: Once | INTRAMUSCULAR | Status: AC
  Administered 2015-09-16: 1 mg via INTRAVENOUS
  Filled 2015-09-16: qty 1

## 2015-09-16 NOTE — ED Provider Notes (Signed)
Time Seen: Approximately 2320  I have reviewed the triage notes  Chief Complaint: Chest Pain and Shortness of Breath   History of Present Illness: Aaron Luna is a 65 y.o. male *who was recently diagnosed with a pneumonia with a left pleural effusion. Did not appear to be any underlying masses, etc. Patient was treated with antibiotics and is followed by pulmonologist. Patient was seen by pulmonologist on January 16 and there had been some patient states now that she's having some transient sharp substernal chest discomfort without radiation. He states his pain seems to be different from his pneumonia type pain which he's aware is in the left lower lobe region. He denies any obvious exacerbating or relieving factors. His pain started on Thursday and seems to be very sharp and transient as nature. Patient denies any fever, productive cough, leg pain or new swelling. He has history of SVT but otherwise no underlying cardiovascular disease. Patient denies any new shortness of breath, nausea, vomiting.   Past Medical History  Diagnosis Date  . SVT (supraventricular tachycardia) (HCC)   . Hypertension   . Hyperglycemia   . Tobacco abuse     Patient Active Problem List   Diagnosis Date Noted  . Pleural effusion 08/01/2015  . Acute respiratory failure with hypoxia (HCC) 07/26/2015  . Pneumonia, community acquired 07/26/2015  . HTN (hypertension), benign 07/26/2015  . PSVT (paroxysmal supraventricular tachycardia) (HCC) 11/04/2012  . Essential hypertension 11/04/2012  . Tobacco abuse 11/04/2012  . Edema 11/04/2012    Past Surgical History  Procedure Laterality Date  . Tonsillectomy    . Cholecystectomy      Past Surgical History  Procedure Laterality Date  . Tonsillectomy    . Cholecystectomy      Current Outpatient Rx  Name  Route  Sig  Dispense  Refill  . albuterol (PROVENTIL HFA;VENTOLIN HFA) 108 (90 BASE) MCG/ACT inhaler   Inhalation   Inhale 1-2 puffs into the  lungs every 6 (six) hours as needed for wheezing or shortness of breath.         Marland Kitchen aspirin 81 MG tablet   Oral   Take 81 mg by mouth daily.         . cloNIDine (CATAPRES) 0.1 MG tablet   Oral   Take 1 tablet (0.1 mg total) by mouth 2 (two) times daily.   60 tablet   1     Pt needs to contact office to schedule future appo ...   . fish oil-omega-3 fatty acids 1000 MG capsule   Oral   Take 1 g by mouth daily.         . furosemide (LASIX) 40 MG tablet   Oral   Take 40 mg by mouth daily.         Marland Kitchen ibuprofen (ADVIL,MOTRIN) 400 MG tablet   Oral   Take 1 tablet (400 mg total) by mouth every 6 (six) hours as needed.   30 tablet   0   . chlorpheniramine-HYDROcodone (TUSSIONEX PENNKINETIC ER) 10-8 MG/5ML SUER   Oral   Take 5 mLs by mouth every 12 (twelve) hours as needed for cough. Reported on 09/16/2015           Allergies:  Review of patient's allergies indicates no known allergies.  Family History: Family History  Problem Relation Age of Onset  . Heart disease Father 20    CABG x 4  . Hypertension Sister     Social History: Social History  Substance Use  Topics  . Smoking status: Former Smoker -- 1.00 packs/day    Types: Cigarettes  . Smokeless tobacco: Former Neurosurgeon    Quit date: 05/12/2015  . Alcohol Use: No     Review of Systems:   10 point review of systems was performed and was otherwise negative:  Constitutional: No fever Eyes: No visual disturbances ENT: No sore throat, ear pain Cardiac: No chest pain Respiratory: No shortness of breath, wheezing, or stridor Abdomen: No abdominal pain, no vomiting, No diarrhea Endocrine: No weight loss, No night sweats Extremities: No peripheral edema, cyanosis Skin: No rashes, easy bruising Neurologic: No focal weakness, trouble with speech or swollowing Urologic: No dysuria, Hematuria, or urinary frequency   Physical Exam:  ED Triage Vitals  Enc Vitals Group     BP 09/16/15 1836 185/90 mmHg      Pulse Rate 09/16/15 1836 78     Resp 09/16/15 1836 20     Temp 09/16/15 1836 98.6 F (37 C)     Temp Source 09/16/15 1836 Oral     SpO2 09/16/15 1836 97 %     Weight 09/16/15 1836 217 lb (98.431 kg)     Height 09/16/15 1836  (1.88 m)     Head Cir --      Peak Flow --      Pain Score 09/16/15 1837 8     Pain Loc --      Pain Edu? --      Excl. in GC? --     General: Awake , Alert , and Oriented times 3; GCS 15 Head: Normal cephalic , atraumatic Eyes: Pupils equal , round, reactive to light Nose/Throat: No nasal drainage, patent upper airway without erythema or exudate.  Neck: Supple, Full range of motion, No anterior adenopathy or palpable thyroid masses Lungs: Coarse breath sounds auscultated at the left base without wheezes or rhonchi Heart: Regular rate, regular rhythm without murmurs , gallops , or rubs Abdomen: Soft, non tender without rebound, guarding , or rigidity; bowel sounds positive and symmetric in all 4 quadrants. No organomegaly .        Extremities: 2 plus symmetric pulses. No edema, clubbing or cyanosis Neurologic: normal ambulation, Motor symmetric without deficits, sensory intact Skin: warm, dry, no rashes   Labs:   All laboratory work was reviewed including any pertinent negatives or positives listed below:  Labs Reviewed  BASIC METABOLIC PANEL - Abnormal; Notable for the following:    Glucose, Bld 121 (*)    All other components within normal limits  CBC - Abnormal; Notable for the following:    RDW 16.2 (*)    All other components within normal limits  TROPONIN I    EKG: * ED ECG REPORT I, Jennye Moccasin, the attending physician, personally viewed and interpreted this ECG.  Date: 09/16/2015 EKG Time: 1843 Rate: 70 Rhythm: normal sinus rhythm QRS Axis: normal Intervals: normal ST/T Wave abnormalities: normal Conduction Disturbances: none Narrative Interpretation: unremarkable Normal EKG  EXAM: CT ANGIOGRAPHY CHEST WITH  CONTRAST  TECHNIQUE: Multidetector CT imaging of the chest was performed using the standard protocol during bolus administration of intravenous contrast. Multiplanar CT image reconstructions and MIPs were obtained to evaluate the vascular anatomy.  CONTRAST: 75mL OMNIPAQUE IOHEXOL 350 MG/ML SOLN  COMPARISON: 07/29/2015  FINDINGS: THORACIC INLET/BODY WALL:  Expanded and hypodense appearance of the lower thyroid isthmus has an unchanged appearance since 2014 chest CT. No surrounding adenopathy or invasive feature. Thyroid ultrasound has already been recommended.  MEDIASTINUM:  Normal heart size. No pericardial effusion. Atherosclerosis of the aorta and great vessels. Notable noncalcified plaque in the proximal left subclavian artery, without flow limiting stenosis. No acute vascular abnormality, including aortic dissection or evidence of pulmonary embolism. No adenopathy.  LUNG WINDOWS:  Complex, loculated pleural effusion on the left with associated smooth but marked pleural thickening. The volume of fluid has decreased since July 29, 2015, with thoracentesis performed in the interval. The 2 largest pockets measure 9 x 3 x 6 cm over the left diaphragm and 6 x 4 x 3 cm in the posterior left mid chest. These pockets may communicate laterally. There is associated atelectasis or lung scarring. Underlying airways are clear. Mild emphysematous changes with hyperinflation.  UPPER ABDOMEN:  No acute findings.  OSSEOUS:  No acute fracture. No suspicious lytic or blastic lesions.  Review of the MIP images confirms the above findings.  IMPRESSION: 1. Negative for pulmonary embolism. 2. Complex left pleural effusion with atelectasis/scarring. Effusion volume has diminished since 07/29/2015 (post thoracentesis), with residual pockets measured above.   Electronically Signed By: Marnee Spring M.D. On: 09/17/2015 00:29          DG Chest 2 View (Final  result) Result time: 09/16/15 19:35:42   Final result by Rad Results In Interface (09/16/15 19:35:42)   Narrative:   CLINICAL DATA: Chest pain and shortness of breath since yesterday.  EXAM: CHEST 2 VIEW  COMPARISON: Most recent radiograph 08/28/2015. Most recent CT 07/29/2015  FINDINGS: Partially loculated left pleural effusion with loculated posterior and lateral components, unchanged in appearance from prior exam. Associated left basilar airspace opacity is again seen. The right lung is clear. The cardiomediastinal contours are normal. No pulmonary edema. No pneumothorax. Unchanged appearance of the osseous structures.  IMPRESSION: Moderate partially loculated left pleural effusion mild left basilar airspace disease, unchanged in appearance from prior exam. No new abnormality.   Electronically Signed By: Rubye Oaks M.D. On: 09/16/2015 19:35    Radiology: *       I personally reviewed the radiologic studies     ED Course:  Differential includes all life-threatening causes for chest pain. This includes but is not exclusive to acute coronary syndrome, aortic dissection, pulmonary embolism, cardiac tamponade, community-acquired pneumonia, pericarditis, musculoskeletal chest wall pain, etc.  Patient's stay here showed symptomatic improvement he was given Ativan for these waves of discomfort which seemed to have an anxiety component. He does not appear to have any immediate life threatening issues such as acute coronary syndrome aortic dissection, pulmonary embolism etc. patient's otherwise hemodynamically stable and I felt could be treated on an outpatient basis. The patient was referred back to his cardiologist and advised to call on Monday for follow-up as soon as possible. He should return here especially if he has an exacerbation of the pain with some associated symptoms, etc.    Assessment:  Acute chest pain syndrome   Final Clinical Impression:    Final diagnoses:  Chest pain syndrome     Plan:  Outpatient management Patient was advised to return immediately if condition worsens. Patient was advised to follow up with their primary care physician or other specialized physicians involved in their outpatient care             Jennye Moccasin, MD 09/17/15 [phone]

## 2015-09-16 NOTE — ED Notes (Signed)
Patient reports recent admission for pneumonia and fluid in his lungs.  Patient reports continued fluid in lungs especially in left lower lung.  Patient reports shooting stabbing pain in left lower lung area and across center of his chest.  Patient appears short of breath with exertion.

## 2015-09-16 NOTE — ED Notes (Signed)
Pt reports chest pain and sob since yesterday.  Pt has recent hx of pneumonia.  No n/v/d  No diaphoresis.  Former smoker.

## 2015-09-17 ENCOUNTER — Emergency Department: Payer: Medicare Other

## 2015-09-17 MED ORDER — IOHEXOL 350 MG/ML SOLN
75.0000 mL | Freq: Once | INTRAVENOUS | Status: AC | PRN
  Administered 2015-09-17: 75 mL via INTRAVENOUS

## 2015-09-17 NOTE — Discharge Instructions (Signed)
Nonspecific Chest Pain It is often hard to find the cause of chest pain. There is always a chance that your pain could be related to something serious, such as a heart attack or a blood clot in your lungs. Chest pain can also be caused by conditions that are not life-threatening. If you have chest pain, it is very important to follow up with your doctor.  HOME CARE  If you were prescribed an antibiotic medicine, finish it all even if you start to feel better.  Avoid any activities that cause chest pain.  Do not use any tobacco products, including cigarettes, chewing tobacco, or electronic cigarettes. If you need help quitting, ask your doctor.  Do not drink alcohol.  Take medicines only as told by your doctor.  Keep all follow-up visits as told by your doctor. This is important. This includes any further testing if your chest pain does not go away.  Your doctor may tell you to keep your head raised (elevated) while you sleep.  Make lifestyle changes as told by your doctor. These may include:  Getting regular exercise. Ask your doctor to suggest some activities that are safe for you.  Eating a heart-healthy diet. Your doctor or a diet specialist (dietitian) can help you to learn healthy eating options.  Maintaining a healthy weight.  Managing diabetes, if necessary.  Reducing stress. GET HELP IF:  Your chest pain does not go away, even after treatment.  You have a rash with blisters on your chest.  You have a fever. GET HELP RIGHT AWAY IF:  Your chest pain is worse.  You have an increasing cough, or you cough up blood.  You have severe belly (abdominal) pain.  You feel extremely weak.  You pass out (faint).  You have chills.  You have sudden, unexplained chest discomfort.  You have sudden, unexplained discomfort in your arms, back, neck, or jaw.  You have shortness of breath at any time.  You suddenly start to sweat, or your skin gets clammy.  You feel  nauseous.  You vomit.  You suddenly feel light-headed or dizzy.  Your heart begins to beat quickly, or it feels like it is skipping beats. These symptoms may be an emergency. Do not wait to see if the symptoms will go away. Get medical help right away. Call your local emergency services (911 in the [place] Do not drive yourself to the hospital.   This information is not intended to replace advice given to you by your health care provider. Make sure you discuss any questions you have with your health care provider.   Document Released: 01/15/2008 Document Revised: 08/19/2014 Document Reviewed: 03/04/2014 Elsevier Interactive Patient Education [employer]  Please return immediately if condition worsens. Please contact her primary physician or the physician you were given for referral. If you have any specialist physicians involved in her treatment and plan please also contact them. Thank you for using [facility]. Please take a baby aspirin a day. Return emergency department especially if he have associated symptoms such as shortness of breath, nausea, vomiting, sweating with the discomfort, etc.

## 2015-09-18 ENCOUNTER — Telehealth: Payer: Self-pay

## 2015-09-18 NOTE — Telephone Encounter (Signed)
Pt states this weekend he started getting pains at the top of his lungs, he went to the ED yesterday and they did MRI and xray and they told him he has a "pocket of fluid in my lungs" Please call.

## 2015-09-18 NOTE — Telephone Encounter (Signed)
Pt states he is having some SOB and some CP. Went to ED over the weekend and had MRI and CXR. Pt states his BP is 188/118. Informed pt he should call his PCP in regards to his BP today. appt scheduled w/VM for tomorrow, 09/19/15 @ 10:40am. Nothing further needed.

## 2015-09-18 NOTE — Telephone Encounter (Signed)
Spoke w/ pt. He reports that he is SOB right now, his BP is 188/118. Pt reports that he had "double pneumonia" last week, had chest pain yesterday and went to ED. He was advised that he still has a "pocket of fluid" on his lungs, but sx were not cardiac related. Pt does have lasix 40 mg that he takes daily; daily wts indicate that he has lost wt, so he is hesitant to take any more. Pt takes clonidine 0.1 mg BID that usually keeps his BP under good control. Advised pt to take an extra clonidine 0.1 mg today and keep his appt w/ Thayer Ohm tomorrow.  Pt understands to proceed to ED if his sx become emergent.  Asked him to call the office after hours if he is concerned about his readings, but not to check his BP frequently. Pt is also sched to see pulmonary tomorrow before appt here.

## 2015-09-18 NOTE — Telephone Encounter (Signed)
Pt states he went to the ED on Saturday night and his BP was elevated. States just a little while ago was 188/118. Pt is seeing [employer].

## 2015-09-19 ENCOUNTER — Ambulatory Visit (INDEPENDENT_AMBULATORY_CARE_PROVIDER_SITE_OTHER): Payer: Medicare Other | Admitting: Internal Medicine

## 2015-09-19 ENCOUNTER — Encounter: Payer: Self-pay | Admitting: Internal Medicine

## 2015-09-19 ENCOUNTER — Encounter: Payer: Self-pay | Admitting: Nurse Practitioner

## 2015-09-19 ENCOUNTER — Ambulatory Visit (INDEPENDENT_AMBULATORY_CARE_PROVIDER_SITE_OTHER): Payer: Medicare Other | Admitting: Nurse Practitioner

## 2015-09-19 VITALS — BP 164/88 | HR 70 | Ht 74.0 in | Wt 217.0 lb

## 2015-09-19 VITALS — BP 170/86 | HR 93 | Ht 74.0 in | Wt 218.0 lb

## 2015-09-19 DIAGNOSIS — I10 Essential (primary) hypertension: Secondary | ICD-10-CM

## 2015-09-19 DIAGNOSIS — J90 Pleural effusion, not elsewhere classified: Secondary | ICD-10-CM | POA: Diagnosis not present

## 2015-09-19 DIAGNOSIS — R0781 Pleurodynia: Secondary | ICD-10-CM

## 2015-09-19 DIAGNOSIS — I471 Supraventricular tachycardia: Secondary | ICD-10-CM | POA: Diagnosis not present

## 2015-09-19 DIAGNOSIS — R0602 Shortness of breath: Secondary | ICD-10-CM | POA: Diagnosis not present

## 2015-09-19 DIAGNOSIS — J948 Other specified pleural conditions: Secondary | ICD-10-CM | POA: Diagnosis not present

## 2015-09-19 MED ORDER — DILTIAZEM HCL ER COATED BEADS 120 MG PO CP24: 120.0000 mg | ORAL_CAPSULE | Freq: Every day | ORAL | Status: DC

## 2015-09-19 MED ORDER — OXYCODONE-ACETAMINOPHEN 5-325 MG PO TABS: 1.0000 | ORAL_TABLET | Freq: Four times a day (QID) | ORAL | Status: AC | PRN

## 2015-09-19 NOTE — Progress Notes (Signed)
Office Visit    Patient Name: Aaron Luna Date of Encounter: 09/19/2015  Primary Care Provider:  [facility] Primary Cardiologist:  Concha Se, MD   Chief Complaint    65 year old male with a history of paroxysmal supraventricular tachycardia who presents for follow-up after recent ED visit.  Past Medical History    Past Medical History  Diagnosis Date  . PSVT (paroxysmal supraventricular tachycardia) (HCC)     a. 10/2012-->coverted with adenosine in ED (6-->12mg );  b 10/2012 Neg MV, EF 59%; c. 10/2012 Echo: EF 55%, nl valves.  . Essential hypertension   . Hyperglycemia   . Tobacco abuse    Past Surgical History  Procedure Laterality Date  . Tonsillectomy    . Cholecystectomy      Allergies  No Known Allergies  History of Present Illness    65 year old male with a prior history of paroxysmal supraventricular tachycardia, diagnosed in March 2014 and successfully treated with adenosine at that time. He had normal stress testing and echo at that time.  Over the years, he says that he had maybe 2 or 3 SVT episodes, which she describes as acute shortness of breath with tachycardia and hypertension. Usually, he'll bear down when this occurs but says that he'll do it for hours at a time. In December 2016, he was admitted to [facility] with respiratory failure, bilateral pneumonia, left-sided pleural effusion. He did require left-sided thoracentesis and was subsequently treated with antibiotics. He is continued to have dyspnea and also left-sided pleuritic chest pain. On February 4, he had acute worsening of dyspnea and says he was having sharp and pleuritic chest pain. He felt as though he was tachycardic and says his blood pressure at home was over 200. He tried to bear down to see if that helped but it did not seem to. After a few hours, he presented to the [facility] emergency department. There, he was found to be in sinus rhythm. Troponins were negative. CT  angiogram of the chest was negative for PE. Complex left pleural effusion with atelectasis/scarring was again noted. He was discharged home and seen by pulmonology this morning. He has been referred to thoracic surgery for further evaluation of what is presumed to be a loculated pleural effusion. He tells me today, that he has continued to have supraventricular tachycardia ever since last Wednesday. He says he hasn't quite gotten over the attack. He is in sinus rhythm at a rate of 70 today.   Home Medications    Prior to Admission medications   Medication Sig Start Date End Date Taking? Authorizing Provider  albuterol (PROVENTIL HFA;VENTOLIN HFA) 108 (90 BASE) MCG/ACT inhaler Inhale 1-2 puffs into the lungs every 6 (six) hours as needed for wheezing or shortness of breath.   Yes Historical Provider, MD  aspirin 81 MG tablet Take 81 mg by mouth daily.   Yes Historical Provider, MD  cloNIDine (CATAPRES) 0.1 MG tablet Take 1 tablet (0.1 mg total) by mouth 2 (two) times daily. 03/02/14  Yes Antonieta Iba, MD  fish oil-omega-3 fatty acids 1000 MG capsule Take 1 g by mouth daily.   Yes Historical Provider, MD  furosemide (LASIX) 40 MG tablet Take 40 mg by mouth daily.   Yes Historical Provider, MD  ibuprofen (ADVIL,MOTRIN) 400 MG tablet Take 1 tablet (400 mg total) by mouth every 6 (six) hours as needed. 08/01/15  Yes Gale Journey, MD  oxyCODONE-acetaminophen (ROXICET) 5-325 MG tablet Take 1-2 tablets by mouth  every 6 (six) hours as needed. 09/19/15 09/18/16 Yes Vishal Mungal, MD  diltiazem (CARDIZEM CD) 120 MG 24 hr capsule Take 1 capsule (120 mg total) by mouth daily. 09/19/15   Ok Anis, NP    Review of Systems    He has been having dyspnea both with exertion and rest. He has been reluctant to take deep breaths secondary to left-sided pleuritic chest pain. He is fairly anxious. He is concerned that he has been having supraventricular tachycardia persistently for the past week despite being  in sinus rhythm both in the emergency department last week and also today.   he denies PND, orthopnea, dizziness, syncope, or early satiety. He has had bilateral ankle edema. All other systems reviewed and are otherwise negative except as noted above.  Physical Exam    VS:  BP 164/88 mmHg  Pulse 70  Ht  (1.88 m)  Wt 217 lb (98.431 kg)  BMI 27.85 kg/m2  SpO2 98% , BMI Body mass index is 27.85 kg/(m^2). GEN: Well nourished, well developed, in no acute distress. HEENT: normal. Neck: Supple, no JVD, carotid bruits, or masses. Cardiac: RRR, no murmurs, rubs, or gallops. No clubbing, cyanosis, edema.  Radials/DP/PT 2+ and equal bilaterally.  Respiratory:  Respirations regular and unlabored,dementia breath sounds on left. Otherwise clear to auscultation  bilaterally. GI: Soft, nontender, nondistended, BS + x 4. MS: no deformity or atrophy. Skin: warm and dry, no rash. Neuro:  Strength and sensation are intact. Psych: Normal affect.  Accessory Clinical Findings    ECG - regular sinus rhythm, 70, no acute ST or T changes.  Assessment & Plan    1.  Paroxysmal supraventricular tachycardia: Patient is concerned that he had an episode of PSVT last Wednesday but then says that he has been in it ever since as well. Documentation in the ER clearly reflects sinus rhythm. He is also in sinus rhythm today. In discussing it with him, I don't think he fully understands that SVT is an arrhythmia as opposed to a chronic underlying condition. I did correct or at least try to correct that perception today. He says that he had tachycardia palpitations on Wednesday and it is certainly possible that he is been having some SVT and perhaps it broke prior to arrival in the emergency department. As his blood pressure has been elevated recently, I have added diltiazem CD 120 mg daily to his daily regimen. He is not interested in wearing a monitor for any additional testing at this time.  2. Essential hypertension:  Blood pressure is elevated at 164/88. He says it is also been high at home ever since he quit smoking about 6 months ago. He is on clonidine. As above, I'm adding diltiazem in the setting of reported palpitations possibly SVT.  3. History of pneumonia with loculated left effusion and ongoing dyspnea/hypoxia: He is being followed closely by pulmonology and has now been referred to thoracic surgery for consideration of other options.  4. Tobacco abuse: He quit 6 months ago. He was congratulated.  5. Disposition: Follow-up with Dr. Mariah Milling in 6 months or sooner if necessary.  Nicolasa Ducking, NP 09/19/2015, 12:58 PM

## 2015-09-19 NOTE — Patient Instructions (Addendum)
Follow up with Dr. Sung Amabile in:6-8 weeks - we will schedule an appointment for you to see cardiothoracic surgery concerning the fluid in your left lung, Dr. Thelma Barge at his next available appointment. -Percocet (5/325), 1-2 tablets every 6 hours, as needed for severe pain. Please take with food -May Stop supplemental oxygen, if needed.

## 2015-09-19 NOTE — Assessment & Plan Note (Signed)
Secondary to possible lung entrapment and loculated pleural effusion in the left upper and lower lobes.  Plan: -Pain management with Percocet (see plan for loculated pleural effusion).  -Follow-up with cardiothoracic surgery

## 2015-09-19 NOTE — Progress Notes (Signed)
PULM OFFICE VISIT  SYNOPSIS:08/28/15 65 y.o. M hospitalized 12/14 - 08/01/15 with L sided PNA and parapneumonic effusion. Underwent thoracentesis 12/18 with 350 cc amber fluid removed. LDH was 1710. Seen in consultation by Pulmonary medicine 12/19 and indicated that he was feeling better. By 12/20 he very much wanted to go home. My concern was that he still had substantial L pleural effusion but he was afebrile and WBC was improving. His PCT was normal. He was discharged with the following plan: 1) Change ceftriaxone/azithromycin to Levofloxacin and complete 3 weeks abx 2) I instructed him to use NSAIDs liberally for chest pain 3) Please arrange home oxygen therapy to be worn @ 2-3 lpm by [place] as close to 24 hrs/day until follow up in pulmonary clinic 4) Pulmonary clinic follow up will be arranged. He will need CXR @ that time. If pleural effusion persists, will need thoracic surgery evaluation. He will also need reassessment of oxygen needs and ultimately should undergo PFTs to assess for COPD  SUBJ: 09/19/15 Patient presents today for an acute care visit of left chest pain after being seen in the emergency room over the weekend for left chest pain.  Today he states he continues to have left chest pain starting in the front radiating to was the back. He had a CT chest performed on September 17 2015, that showed rounded atelectasis versus loculated left pleural effusion with possible lung entrapment in the left lung, his cardiac workup was negative and he was discharged from the ED on that day. Today he states he's been using ibuprofen for pain management, Tylenol has not been helping, he still endorsing sharp left-sided chest pain. Review of CT by myself showed rounded atelectasis versus lung entrapment in the left lower base followed by loculated pleural effusion in the left upper lobe. He continue to have significant left-sided pleurodynia. He states that he has stopped using oxygen since last  Thursday.   OBJ: Filed Vitals:   09/19/15 1038  BP: 170/86  Pulse: 93  Height:  (1.88 m)  Weight: 218 lb (98.884 kg)  SpO2: 94%   Ambulatory RA SpO2 93%  NAD HEENT WNL No JVD Diminished BS in lower half of L lung, no wheezes RRR s M NABS, soft No C/C/E  (The following images and results were reviewed by Dr. Dema Severin on 09/19/2015).  CXR (08/28/15): Persistent L pleural opacity c/w pleural effusion  CT ANGIOGRAPHY CHEST WITH CONTRAST 09/17/15  TECHNIQUE: Multidetector CT imaging of the chest was performed using the standard protocol during bolus administration of intravenous contrast. Multiplanar CT image reconstructions and MIPs were obtained to evaluate the vascular anatomy.  CONTRAST: 75mL OMNIPAQUE IOHEXOL 350 MG/ML SOLN  COMPARISON: 07/29/2015  FINDINGS: THORACIC INLET/BODY WALL:  Expanded and hypodense appearance of the lower thyroid isthmus has an unchanged appearance since 2014 chest CT. No surrounding adenopathy or invasive feature. Thyroid ultrasound has already been recommended.  MEDIASTINUM:  Normal heart size. No pericardial effusion. Atherosclerosis of the aorta and great vessels. Notable noncalcified plaque in the proximal left subclavian artery, without flow limiting stenosis. No acute vascular abnormality, including aortic dissection or evidence of pulmonary embolism. No adenopathy.  LUNG WINDOWS:  Complex, loculated pleural effusion on the left with associated smooth but marked pleural thickening. The volume of fluid has decreased since July 29, 2015, with thoracentesis performed in the interval. The 2 largest pockets measure 9 x 3 x 6 cm over the left diaphragm and 6 x 4 x 3 cm in the posterior left  mid chest. These pockets may communicate laterally. There is associated atelectasis or lung scarring. Underlying airways are clear. Mild emphysematous changes with hyperinflation.  UPPER ABDOMEN:  No acute  findings.  OSSEOUS:  No acute fracture. No suspicious lytic or blastic lesions.  Review of the MIP images confirms the above findings.  IMPRESSION: 1. Negative for pulmonary embolism. 2. Complex left pleural effusion with atelectasis/scarring. Effusion volume has diminished since 07/29/2015 (post thoracentesis), with residual pockets measured above.   Assessment and plan: 65 year old male with loculated left-sided pleural effusion and pleurodynia, seen for follow up.   Loculated pleural effusion CT chest from 09/17/15 reviewed. -Left lung base with possible rounded atelectasis versus loculated pleural effusion, left upper lobe with most likely loculated pleural effusion with thick pleural thickening.  I have discussed the case with Dr. Thelma Barge, cardiac thoracic surgeon, he has agreed to see the patient in clinic for further surgical evaluation.  Plan: -Pain management-Percocet 5/325, 1-2 tablets every 6 hours as needed for severe chest pain, dispensed 30 no refills. -Follow-up with cardiothoracic surgery -Keep follow-up with Dr. Sung Amabile  Pleurodynia Secondary to possible lung entrapment and loculated pleural effusion in the left upper and lower lobes.  Plan: -Pain management with Percocet (see plan for loculated pleural effusion).  -Follow-up with cardiothoracic surgery     Updated Medication List Outpatient Encounter Prescriptions as of 09/19/2015  Medication Sig  . albuterol (PROVENTIL HFA;VENTOLIN HFA) 108 (90 BASE) MCG/ACT inhaler Inhale 1-2 puffs into the lungs every 6 (six) hours as needed for wheezing or shortness of breath.  Marland Kitchen aspirin 81 MG tablet Take 81 mg by mouth daily.  . cloNIDine (CATAPRES) 0.1 MG tablet Take 1 tablet (0.1 mg total) by mouth 2 (two) times daily.  . fish oil-omega-3 fatty acids 1000 MG capsule Take 1 g by mouth daily.  . furosemide (LASIX) 40 MG tablet Take 40 mg by mouth daily.  Marland Kitchen ibuprofen (ADVIL,MOTRIN) 400 MG tablet Take 1 tablet (400 mg  total) by mouth every 6 (six) hours as needed.  Marland Kitchen oxyCODONE-acetaminophen (ROXICET) 5-325 MG tablet Take 1-2 tablets by mouth every 6 (six) hours as needed.  . [DISCONTINUED] chlorpheniramine-HYDROcodone (TUSSIONEX PENNKINETIC ER) 10-8 MG/5ML SUER Take 5 mLs by mouth every 12 (twelve) hours as needed for cough. Reported on 09/19/2015   No facility-administered encounter medications on file as of 09/19/2015.    Orders for this visit: Orders Placed This Encounter  Procedures  . Ambulatory referral to Cardiothoracic Surgery    Referral Priority:  Routine    Referral Type:  Surgical    Referral Reason:  Specialty Services Required    Requested Specialty:  Cardiothoracic Surgery    Number of Visits Requested:  1    Stephanie Acre, MD [facility] [phone]

## 2015-09-19 NOTE — Assessment & Plan Note (Signed)
CT chest from 09/17/15 reviewed. -Left lung base with possible rounded atelectasis versus loculated pleural effusion, left upper lobe with most likely loculated pleural effusion with thick pleural thickening.  I have discussed the case with Dr. Thelma Barge, cardiac thoracic surgeon, he has agreed to see the patient in clinic for further surgical evaluation.  Plan: -Pain management-Percocet 5/325, 1-2 tablets every 6 hours as needed for severe chest pain, dispensed 30 no refills. -Follow-up with cardiothoracic surgery -Keep follow-up with Dr. Sung Amabile

## 2015-09-19 NOTE — Patient Instructions (Signed)
Medication Instructions:  Your physician has recommended you make the following change in your medication:  START taking diltiazem  daily   Labwork: none  Testing/Procedures: none  Follow-Up: Your physician wants you to follow-up in: six months with Dr. Mariah Milling. You will receive a reminder letter in the mail two months in advance. If you don't receive a letter, please call our office to schedule the follow-up appointment.   Any Other Special Instructions Will Be Listed Below (If Applicable).     If you need a refill on your cardiac medications before your next appointment, please call your pharmacy.

## 2015-09-20 ENCOUNTER — Telehealth: Payer: Self-pay | Admitting: *Deleted

## 2015-09-20 NOTE — Telephone Encounter (Signed)
Notified of change of appt to 09/21/15 at 2pm.

## 2015-09-21 ENCOUNTER — Inpatient Hospital Stay: Payer: Medicare Other | Attending: Cardiothoracic Surgery | Admitting: Cardiothoracic Surgery

## 2015-09-21 VITALS — BP 149/87 | HR 64 | Temp 97.8°F | Resp 18 | Ht 74.0 in | Wt 220.5 lb

## 2015-09-21 DIAGNOSIS — J90 Pleural effusion, not elsewhere classified: Secondary | ICD-10-CM | POA: Diagnosis not present

## 2015-09-21 NOTE — Progress Notes (Signed)
Patient ID: Aaron Luna, male   DOB: June 02, 1951, 65 y.o.   MRN: [id number]  Chief Complaint  Patient presents with  . Plural Effusion    Referral- Dr. Dema Severin    Referred By Dr. Dema Severin Reason for Referral left-sided pleural effusion  HPI Location, Quality, Duration, Severity, Timing, Context, Modifying Factors, Associated Signs and Symptoms.  CASHTON Luna is a 65 y.o. male.  His problems began back in early December when he was admitted to the hospital with a parapneumonic effusion secondary to a left-sided pneumonia. He did undergo a thoracentesis back then and was treated with a prolonged course of antibiotic therapy. Over the last 2 months he's been continually improving except for the last week or so when he began experiencing increasing pain along his left lateral chest wall. He states that the pain was so severe that he went to the emergency department where a repeat CT scan confirmed that the pleural effusion which is now loculated has improved yet still remains to a small degree or moderate degree. There is no underlying obvious lung pathology except for some mild collapse of the left lower lobe which may be just compressive atelectasis. The patient also states that he's had a mild marked increase in his shortness of breath with the pain. Several weeks ago he was not complaining of pain or shortness of breath. He did see Dr. Dema Severin who discussed the case with me. I saw him today in consideration of decortication. The patient states that he has not had any fevers or cough. His primary complaint is intermittent severe chest pain which she describes as stabbing in nature which lasted for several minutes and then quickly abates. He states that he's been taking Cardizem for his supraventricular tachyarrhythmia and this has actually helped his pain. I'm not clear how that could be related but he does notice a relationship between the taking of Cardizem and improvement in his pain symptoms. He  quit smoking several months ago. Prior to that time he was a very heavy smoker. He does drink at least 2 sixpacks a week.   Past Medical History  Diagnosis Date  . PSVT (paroxysmal supraventricular tachycardia) (HCC)     a. 10/2012-->coverted with adenosine in ED (6-->12mg );  b 10/2012 Neg MV, EF 59%; c. 10/2012 Echo: EF 55%, nl valves.  . Essential hypertension   . Hyperglycemia   . Tobacco abuse     Past Surgical History  Procedure Laterality Date  . Tonsillectomy    . Cholecystectomy      Family History  Problem Relation Age of Onset  . Heart disease Father 32    CABG x 4  . Hypertension Sister     Social History Social History  Substance Use Topics  . Smoking status: Former Smoker -- 1.00 packs/day    Types: Cigarettes  . Smokeless tobacco: Former Neurosurgeon    Quit date: 05/12/2015  . Alcohol Use: No    No Known Allergies  Current Outpatient Prescriptions  Medication Sig Dispense Refill  . albuterol (PROVENTIL HFA;VENTOLIN HFA) 108 (90 BASE) MCG/ACT inhaler Inhale 1-2 puffs into the lungs every 6 (six) hours as needed for wheezing or shortness of breath.    Marland Kitchen aspirin 81 MG tablet Take 81 mg by mouth daily.    . cloNIDine (CATAPRES) 0.1 MG tablet Take 1 tablet (0.1 mg total) by mouth 2 (two) times daily. 60 tablet 1  . diltiazem (CARDIZEM CD) 120 MG 24 hr capsule Take 1 capsule (120 mg total)  by mouth daily. 30 capsule 6  . fish oil-omega-3 fatty acids 1000 MG capsule Take 1 g by mouth daily.    . furosemide (LASIX) 40 MG tablet Take 40 mg by mouth daily.    Marland Kitchen ibuprofen (ADVIL,MOTRIN) 400 MG tablet Take 1 tablet (400 mg total) by mouth every 6 (six) hours as needed. 30 tablet 0  . oxyCODONE-acetaminophen (ROXICET) 5-325 MG tablet Take 1-2 tablets by mouth every 6 (six) hours as needed. 30 tablet 0   No current facility-administered medications for this visit.      Review of Systems A complete review of systems was asked and was negative except for the following  positive findings chest pain, swelling of his lower extremities, shortness of breath, frequent urination.  Blood pressure 149/87, pulse 64, temperature 97.8 F (36.6 C), temperature source Tympanic, resp. rate 18, height  (1.88 m), weight 220 lb 7.4 oz (100 kg), SpO2 97 %.  Physical Exam CONSTITUTIONAL:  Pleasant, well-developed, well-nourished, and in no acute distress. EYES: Pupils equal and reactive to light, Sclera non-icteric EARS, NOSE, MOUTH AND THROAT:  The oropharynx was clear.  Dentition is good repair.  Oral mucosa pink and moist. LYMPH NODES:  Lymph nodes in the neck and axillae were normal RESPIRATORY:  Lungs were clear on the right and slightly diminished on the left..  Normal respiratory effort without pathologic use of accessory muscles of respiration CARDIOVASCULAR: Heart was regular without murmurs.  There were no carotid bruits. GI: The abdomen was soft, nontender, and nondistended. There were no palpable masses. There was no hepatosplenomegaly. There were normal bowel sounds in all quadrants. GU:  Rectal deferred.   MUSCULOSKELETAL:  Normal muscle strength and tone.  No clubbing or cyanosis.   SKIN:  There were no pathologic skin lesions.  There were no nodules on palpation. NEUROLOGIC:  Sensation is normal.  Cranial nerves are grossly intact. PSYCH:  Oriented to person, place and time.  Mood and affect are normal.  Data Reviewed CT scans and chest x-rays  I have personally reviewed the patient's imaging, laboratory findings and medical records.    Assessment    I had a long discussion with the patient regarding the management of his symptoms. It would appear that pain is his primary problem. He does not believe he has any infection within the pleural fluid. I told him that performing a thoracotomy for pain alone would be difficult to justify unless there is some significant pathology present within the pleural space. He declined any drainage or sampling of the  pleural fluid. After reviewing the operative procedure with the patient he declined that as well.    Plan    I explained to him that I would continue to monitor the pleural effusion. He is scheduled to follow-up with his pulmonologist in 2 weeks. I explained to him that he should keep that and if there is any further problems or issues that he should contact his primary care physician orders pulmonologist to review. I did not make a return visit for but would be happy to see him should the need arise.    Hulda Marin, MD 09/21/2015, 3:10 PM

## 2015-10-06 ENCOUNTER — Other Ambulatory Visit: Payer: Self-pay | Admitting: Internal Medicine

## 2015-10-06 DIAGNOSIS — J869 Pyothorax without fistula: Secondary | ICD-10-CM

## 2015-10-11 ENCOUNTER — Ambulatory Visit
Admission: RE | Admit: 2015-10-11 | Discharge: 2015-10-11 | Disposition: A | Discharge status: Home / Self Care | Payer: Medicare Other | Source: Ambulatory Visit | Attending: Internal Medicine | Admitting: Internal Medicine

## 2015-10-11 DIAGNOSIS — J869 Pyothorax without fistula: Secondary | ICD-10-CM | POA: Insufficient documentation

## 2015-10-11 DIAGNOSIS — R918 Other nonspecific abnormal finding of lung field: Secondary | ICD-10-CM | POA: Diagnosis not present

## 2015-10-11 DIAGNOSIS — J90 Pleural effusion, not elsewhere classified: Secondary | ICD-10-CM | POA: Diagnosis not present

## 2015-10-11 MED ORDER — IOHEXOL 350 MG/ML SOLN
75.0000 mL | Freq: Once | INTRAVENOUS | Status: AC | PRN
  Administered 2015-10-11: 75 mL via INTRAVENOUS

## 2015-10-16 ENCOUNTER — Encounter: Payer: Self-pay | Admitting: Pulmonary Disease

## 2015-10-16 ENCOUNTER — Ambulatory Visit
Admission: RE | Admit: 2015-10-16 | Discharge: 2015-10-16 | Disposition: A | Discharge status: Home / Self Care | Payer: Medicare Other | Source: Ambulatory Visit | Attending: Pulmonary Disease | Admitting: Pulmonary Disease

## 2015-10-16 ENCOUNTER — Ambulatory Visit (INDEPENDENT_AMBULATORY_CARE_PROVIDER_SITE_OTHER): Payer: Medicare Other | Admitting: Pulmonary Disease

## 2015-10-16 VITALS — BP 160/82 | HR 87 | Ht 74.0 in | Wt 223.4 lb

## 2015-10-16 DIAGNOSIS — J449 Chronic obstructive pulmonary disease, unspecified: Secondary | ICD-10-CM

## 2015-10-16 DIAGNOSIS — R0600 Dyspnea, unspecified: Secondary | ICD-10-CM

## 2015-10-16 DIAGNOSIS — J189 Pneumonia, unspecified organism: Secondary | ICD-10-CM

## 2015-10-16 DIAGNOSIS — J90 Pleural effusion, not elsewhere classified: Secondary | ICD-10-CM | POA: Diagnosis not present

## 2015-10-16 DIAGNOSIS — R600 Localized edema: Secondary | ICD-10-CM

## 2015-10-16 DIAGNOSIS — J181 Lobar pneumonia, unspecified organism: Principal | ICD-10-CM

## 2015-10-16 MED ORDER — UMECLIDINIUM-VILANTEROL 62.5-25 MCG/INH IN AEPB: 1.0000 | INHALATION_SPRAY | Freq: Every day | RESPIRATORY_TRACT | Status: DC

## 2015-10-16 MED ORDER — UMECLIDINIUM-VILANTEROL 62.5-25 MCG/INH IN AEPB
1.0000 | INHALATION_SPRAY | Freq: Every day | RESPIRATORY_TRACT | Status: AC
Start: 2015-10-16 — End: 2015-10-17

## 2015-10-20 NOTE — Progress Notes (Signed)
PROBLEMS: Former smoker COPD Hospitalized 12/14 - 08/01/15 with CAP, parapneumonic effusion treated conservatively Persistent pleural effusion and pleuroynia   INTERVAL HISTORY: 09/16/15 ED with acute L sided chest pain 09/17/15 CT chest: Complex left pleural effusion with atelectasis/scarring. Effusion volume has diminished since 07/29/2015 (post thoracentesis), with residual pockets 09/19/15 Seen by VM and referred to Thoracic Surgery 09/21/15 Thoracic Surgery office eval: no surgery planned 10/11/15 CT chest: Improvement in L pleural effusion and LLL atelectasis   SUBJ: Very frustrated with persistent dyspnea and L sided CP. Reports new increase in BLE edema. Denies fever, purulent sputum, hemoptysis and calf tenderness  OBJ: Filed Vitals:   10/16/15 1145  BP: 160/82  Pulse: 87  Height: 6\' 2"  (1.88 m)  Weight: 223 lb 6.4 oz (101.334 kg)  SpO2: 96%   Appears agitated and mildly dyspneic with speech HEENT WNL No JVD Markedly diminished BS bilaterally, no wheezes RRR s M NABS, soft 2+ symmetric LE edema No focal neuro deficits   DATA: Rcent CTs and CXR reviewed  IMPRESSION: COPD Resolving L parapneumonic effusion Increasing dyspnea LE edema  PLAN: LE venous US ordered Trial of Anoro ROV 4 weeks   Merwyn Katosavid B Demarlo Riojas, MD [facility]

## 2015-11-16 ENCOUNTER — Ambulatory Visit: Payer: Medicare Other | Admitting: Pulmonary Disease

## 2015-11-21 ENCOUNTER — Other Ambulatory Visit: Payer: Self-pay | Admitting: Internal Medicine

## 2015-11-21 DIAGNOSIS — J869 Pyothorax without fistula: Secondary | ICD-10-CM

## 2015-11-28 ENCOUNTER — Ambulatory Visit
Admission: RE | Admit: 2015-11-28 | Discharge: 2015-11-28 | Disposition: A | Discharge status: Home / Self Care | Payer: Medicare Other | Source: Ambulatory Visit | Attending: Internal Medicine | Admitting: Internal Medicine

## 2015-11-28 DIAGNOSIS — J90 Pleural effusion, not elsewhere classified: Secondary | ICD-10-CM | POA: Insufficient documentation

## 2015-11-28 DIAGNOSIS — R918 Other nonspecific abnormal finding of lung field: Secondary | ICD-10-CM | POA: Insufficient documentation

## 2015-11-28 DIAGNOSIS — J869 Pyothorax without fistula: Secondary | ICD-10-CM | POA: Insufficient documentation

## 2015-11-28 LAB — POCT I-STAT CREATININE: CREATININE: 0.9 mg/dL (ref 0.61–1.24)

## 2015-11-28 MED ORDER — IOPAMIDOL (ISOVUE-370) INJECTION 76%
75.0000 mL | Freq: Once | INTRAVENOUS | Status: AC | PRN
  Administered 2015-11-28: 75 mL via INTRAVENOUS

## 2015-12-01 ENCOUNTER — Other Ambulatory Visit: Payer: Self-pay | Admitting: Physician Assistant

## 2015-12-01 DIAGNOSIS — J90 Pleural effusion, not elsewhere classified: Secondary | ICD-10-CM

## 2015-12-05 ENCOUNTER — Ambulatory Visit
Admission: RE | Admit: 2015-12-05 | Discharge: 2015-12-05 | Disposition: A | Discharge status: Home / Self Care | Payer: Medicare Other | Source: Ambulatory Visit | Attending: Physician Assistant | Admitting: Physician Assistant

## 2015-12-05 DIAGNOSIS — J90 Pleural effusion, not elsewhere classified: Secondary | ICD-10-CM

## 2015-12-05 DIAGNOSIS — R0602 Shortness of breath: Secondary | ICD-10-CM | POA: Diagnosis present

## 2015-12-05 LAB — PROTEIN, BODY FLUID: TOTAL PROTEIN, FLUID: 3.3 g/dL

## 2015-12-05 LAB — GLUCOSE, SEROUS FLUID

## 2015-12-07 LAB — CYTOLOGY - NON PAP

## 2015-12-12 DIAGNOSIS — J181 Lobar pneumonia, unspecified organism: Secondary | ICD-10-CM | POA: Diagnosis not present

## 2015-12-12 DIAGNOSIS — R339 Retention of urine, unspecified: Secondary | ICD-10-CM | POA: Diagnosis not present

## 2015-12-12 DIAGNOSIS — R6883 Chills (without fever): Secondary | ICD-10-CM | POA: Diagnosis not present

## 2015-12-12 DIAGNOSIS — R0600 Dyspnea, unspecified: Secondary | ICD-10-CM | POA: Diagnosis not present

## 2015-12-12 DIAGNOSIS — J449 Chronic obstructive pulmonary disease, unspecified: Secondary | ICD-10-CM | POA: Diagnosis not present

## 2015-12-12 DIAGNOSIS — J9571 Accidental puncture and laceration of a respiratory system organ or structure during a respiratory system procedure: Secondary | ICD-10-CM | POA: Diagnosis not present

## 2015-12-12 DIAGNOSIS — Z4682 Encounter for fitting and adjustment of non-vascular catheter: Secondary | ICD-10-CM | POA: Diagnosis not present

## 2015-12-12 DIAGNOSIS — J986 Disorders of diaphragm: Secondary | ICD-10-CM | POA: Diagnosis not present

## 2015-12-12 DIAGNOSIS — J440 Chronic obstructive pulmonary disease with acute lower respiratory infection: Secondary | ICD-10-CM | POA: Diagnosis not present

## 2015-12-12 DIAGNOSIS — D696 Thrombocytopenia, unspecified: Secondary | ICD-10-CM | POA: Diagnosis not present

## 2015-12-12 DIAGNOSIS — I471 Supraventricular tachycardia: Secondary | ICD-10-CM | POA: Diagnosis not present

## 2015-12-12 DIAGNOSIS — R61 Generalized hyperhidrosis: Secondary | ICD-10-CM | POA: Diagnosis not present

## 2015-12-12 DIAGNOSIS — Z87891 Personal history of nicotine dependence: Secondary | ICD-10-CM | POA: Diagnosis not present

## 2015-12-12 DIAGNOSIS — J851 Abscess of lung with pneumonia: Secondary | ICD-10-CM | POA: Diagnosis not present

## 2015-12-12 DIAGNOSIS — J941 Fibrothorax: Secondary | ICD-10-CM | POA: Diagnosis not present

## 2015-12-12 DIAGNOSIS — D62 Acute posthemorrhagic anemia: Secondary | ICD-10-CM | POA: Diagnosis not present

## 2015-12-12 DIAGNOSIS — I10 Essential (primary) hypertension: Secondary | ICD-10-CM | POA: Diagnosis not present

## 2015-12-12 DIAGNOSIS — J918 Pleural effusion in other conditions classified elsewhere: Secondary | ICD-10-CM | POA: Diagnosis not present

## 2015-12-12 DIAGNOSIS — E46 Unspecified protein-calorie malnutrition: Secondary | ICD-10-CM | POA: Diagnosis not present

## 2015-12-12 DIAGNOSIS — R509 Fever, unspecified: Secondary | ICD-10-CM | POA: Diagnosis not present

## 2015-12-12 DIAGNOSIS — J948 Other specified pleural conditions: Secondary | ICD-10-CM | POA: Diagnosis not present

## 2015-12-12 DIAGNOSIS — R918 Other nonspecific abnormal finding of lung field: Secondary | ICD-10-CM | POA: Diagnosis not present

## 2015-12-12 DIAGNOSIS — Z9889 Other specified postprocedural states: Secondary | ICD-10-CM | POA: Diagnosis not present

## 2015-12-12 DIAGNOSIS — R911 Solitary pulmonary nodule: Secondary | ICD-10-CM | POA: Diagnosis not present

## 2015-12-12 DIAGNOSIS — Z9049 Acquired absence of other specified parts of digestive tract: Secondary | ICD-10-CM | POA: Diagnosis not present

## 2015-12-12 DIAGNOSIS — J9811 Atelectasis: Secondary | ICD-10-CM | POA: Diagnosis not present

## 2015-12-12 DIAGNOSIS — J984 Other disorders of lung: Secondary | ICD-10-CM | POA: Diagnosis not present

## 2015-12-12 DIAGNOSIS — R071 Chest pain on breathing: Secondary | ICD-10-CM | POA: Diagnosis not present

## 2015-12-12 DIAGNOSIS — G8918 Other acute postprocedural pain: Secondary | ICD-10-CM | POA: Diagnosis not present

## 2015-12-12 DIAGNOSIS — G8912 Acute post-thoracotomy pain: Secondary | ICD-10-CM | POA: Diagnosis not present

## 2015-12-12 DIAGNOSIS — S27803A Laceration of diaphragm, initial encounter: Secondary | ICD-10-CM | POA: Diagnosis not present

## 2015-12-12 DIAGNOSIS — D72829 Elevated white blood cell count, unspecified: Secondary | ICD-10-CM | POA: Diagnosis not present

## 2015-12-12 DIAGNOSIS — Z5339 Other specified procedure converted to open procedure: Secondary | ICD-10-CM | POA: Diagnosis not present

## 2015-12-12 DIAGNOSIS — R739 Hyperglycemia, unspecified: Secondary | ICD-10-CM | POA: Diagnosis not present

## 2015-12-12 DIAGNOSIS — Z79899 Other long term (current) drug therapy: Secondary | ICD-10-CM | POA: Diagnosis not present

## 2015-12-12 DIAGNOSIS — J951 Acute pulmonary insufficiency following thoracic surgery: Secondary | ICD-10-CM | POA: Diagnosis not present

## 2015-12-12 DIAGNOSIS — J90 Pleural effusion, not elsewhere classified: Secondary | ICD-10-CM | POA: Diagnosis not present

## 2015-12-12 DIAGNOSIS — R079 Chest pain, unspecified: Secondary | ICD-10-CM | POA: Diagnosis not present

## 2015-12-12 DIAGNOSIS — M8448XA Pathological fracture, other site, initial encounter for fracture: Secondary | ICD-10-CM | POA: Diagnosis not present

## 2015-12-12 DIAGNOSIS — Z9981 Dependence on supplemental oxygen: Secondary | ICD-10-CM | POA: Diagnosis not present

## 2015-12-12 DIAGNOSIS — T797XXA Traumatic subcutaneous emphysema, initial encounter: Secondary | ICD-10-CM | POA: Diagnosis not present

## 2015-12-12 DIAGNOSIS — R0989 Other specified symptoms and signs involving the circulatory and respiratory systems: Secondary | ICD-10-CM | POA: Diagnosis not present

## 2015-12-12 DIAGNOSIS — J869 Pyothorax without fistula: Secondary | ICD-10-CM | POA: Diagnosis not present

## 2015-12-12 DIAGNOSIS — B954 Other streptococcus as the cause of diseases classified elsewhere: Secondary | ICD-10-CM | POA: Diagnosis not present

## 2015-12-12 DIAGNOSIS — A419 Sepsis, unspecified organism: Secondary | ICD-10-CM | POA: Diagnosis not present

## 2015-12-14 LAB — CULTURE, BODY FLUID W GRAM STAIN -BOTTLE

## 2015-12-14 LAB — CULTURE, BODY FLUID-BOTTLE

## 2015-12-27 DIAGNOSIS — R079 Chest pain, unspecified: Secondary | ICD-10-CM | POA: Diagnosis not present

## 2015-12-27 DIAGNOSIS — J9811 Atelectasis: Secondary | ICD-10-CM | POA: Diagnosis not present

## 2015-12-27 DIAGNOSIS — Z452 Encounter for adjustment and management of vascular access device: Secondary | ICD-10-CM | POA: Diagnosis not present

## 2015-12-27 DIAGNOSIS — R0602 Shortness of breath: Secondary | ICD-10-CM | POA: Diagnosis not present

## 2015-12-27 DIAGNOSIS — R918 Other nonspecific abnormal finding of lung field: Secondary | ICD-10-CM | POA: Diagnosis not present

## 2015-12-27 DIAGNOSIS — I10 Essential (primary) hypertension: Secondary | ICD-10-CM | POA: Diagnosis not present

## 2015-12-27 DIAGNOSIS — J948 Other specified pleural conditions: Secondary | ICD-10-CM | POA: Diagnosis not present

## 2015-12-27 DIAGNOSIS — Z87891 Personal history of nicotine dependence: Secondary | ICD-10-CM | POA: Diagnosis not present

## 2015-12-27 DIAGNOSIS — J90 Pleural effusion, not elsewhere classified: Secondary | ICD-10-CM | POA: Diagnosis not present

## 2015-12-27 DIAGNOSIS — Z9889 Other specified postprocedural states: Secondary | ICD-10-CM | POA: Diagnosis not present

## 2015-12-27 DIAGNOSIS — J449 Chronic obstructive pulmonary disease, unspecified: Secondary | ICD-10-CM | POA: Diagnosis not present

## 2015-12-27 DIAGNOSIS — R0789 Other chest pain: Secondary | ICD-10-CM | POA: Diagnosis not present

## 2016-01-01 DIAGNOSIS — Z9981 Dependence on supplemental oxygen: Secondary | ICD-10-CM | POA: Diagnosis not present

## 2016-01-01 DIAGNOSIS — Z7982 Long term (current) use of aspirin: Secondary | ICD-10-CM | POA: Diagnosis not present

## 2016-01-01 DIAGNOSIS — J189 Pneumonia, unspecified organism: Secondary | ICD-10-CM | POA: Diagnosis not present

## 2016-01-01 DIAGNOSIS — Z87891 Personal history of nicotine dependence: Secondary | ICD-10-CM | POA: Diagnosis not present

## 2016-01-01 DIAGNOSIS — Z792 Long term (current) use of antibiotics: Secondary | ICD-10-CM | POA: Diagnosis not present

## 2016-01-01 DIAGNOSIS — Z9181 History of falling: Secondary | ICD-10-CM | POA: Diagnosis not present

## 2016-01-01 DIAGNOSIS — Z48813 Encounter for surgical aftercare following surgery on the respiratory system: Secondary | ICD-10-CM | POA: Diagnosis not present

## 2016-01-02 DIAGNOSIS — Z4802 Encounter for removal of sutures: Secondary | ICD-10-CM | POA: Diagnosis not present

## 2016-01-02 DIAGNOSIS — J90 Pleural effusion, not elsewhere classified: Secondary | ICD-10-CM | POA: Diagnosis not present

## 2016-01-02 DIAGNOSIS — Z09 Encounter for follow-up examination after completed treatment for conditions other than malignant neoplasm: Secondary | ICD-10-CM | POA: Diagnosis not present

## 2016-01-02 DIAGNOSIS — Z9889 Other specified postprocedural states: Secondary | ICD-10-CM | POA: Diagnosis not present

## 2016-01-02 DIAGNOSIS — R918 Other nonspecific abnormal finding of lung field: Secondary | ICD-10-CM | POA: Diagnosis not present

## 2016-01-06 DIAGNOSIS — J189 Pneumonia, unspecified organism: Secondary | ICD-10-CM | POA: Diagnosis not present

## 2016-01-06 DIAGNOSIS — Z87891 Personal history of nicotine dependence: Secondary | ICD-10-CM | POA: Diagnosis not present

## 2016-01-06 DIAGNOSIS — Z792 Long term (current) use of antibiotics: Secondary | ICD-10-CM | POA: Diagnosis not present

## 2016-01-06 DIAGNOSIS — Z9181 History of falling: Secondary | ICD-10-CM | POA: Diagnosis not present

## 2016-01-06 DIAGNOSIS — Z48813 Encounter for surgical aftercare following surgery on the respiratory system: Secondary | ICD-10-CM | POA: Diagnosis not present

## 2016-01-06 DIAGNOSIS — Z7982 Long term (current) use of aspirin: Secondary | ICD-10-CM | POA: Diagnosis not present

## 2016-01-06 DIAGNOSIS — Z9981 Dependence on supplemental oxygen: Secondary | ICD-10-CM | POA: Diagnosis not present

## 2016-01-09 DIAGNOSIS — Z9181 History of falling: Secondary | ICD-10-CM | POA: Diagnosis not present

## 2016-01-09 DIAGNOSIS — Z7982 Long term (current) use of aspirin: Secondary | ICD-10-CM | POA: Diagnosis not present

## 2016-01-09 DIAGNOSIS — Z48813 Encounter for surgical aftercare following surgery on the respiratory system: Secondary | ICD-10-CM | POA: Diagnosis not present

## 2016-01-09 DIAGNOSIS — J189 Pneumonia, unspecified organism: Secondary | ICD-10-CM | POA: Diagnosis not present

## 2016-01-09 DIAGNOSIS — Z9981 Dependence on supplemental oxygen: Secondary | ICD-10-CM | POA: Diagnosis not present

## 2016-01-09 DIAGNOSIS — Z87891 Personal history of nicotine dependence: Secondary | ICD-10-CM | POA: Diagnosis not present

## 2016-01-09 DIAGNOSIS — Z792 Long term (current) use of antibiotics: Secondary | ICD-10-CM | POA: Diagnosis not present

## 2016-01-13 DIAGNOSIS — Z48813 Encounter for surgical aftercare following surgery on the respiratory system: Secondary | ICD-10-CM | POA: Diagnosis not present

## 2016-01-13 DIAGNOSIS — Z9181 History of falling: Secondary | ICD-10-CM | POA: Diagnosis not present

## 2016-01-13 DIAGNOSIS — Z792 Long term (current) use of antibiotics: Secondary | ICD-10-CM | POA: Diagnosis not present

## 2016-01-13 DIAGNOSIS — Z7982 Long term (current) use of aspirin: Secondary | ICD-10-CM | POA: Diagnosis not present

## 2016-01-13 DIAGNOSIS — Z9981 Dependence on supplemental oxygen: Secondary | ICD-10-CM | POA: Diagnosis not present

## 2016-01-13 DIAGNOSIS — Z87891 Personal history of nicotine dependence: Secondary | ICD-10-CM | POA: Diagnosis not present

## 2016-01-13 DIAGNOSIS — J189 Pneumonia, unspecified organism: Secondary | ICD-10-CM | POA: Diagnosis not present

## 2016-01-16 DIAGNOSIS — R918 Other nonspecific abnormal finding of lung field: Secondary | ICD-10-CM | POA: Diagnosis not present

## 2016-01-16 DIAGNOSIS — J90 Pleural effusion, not elsewhere classified: Secondary | ICD-10-CM | POA: Diagnosis not present

## 2016-01-16 DIAGNOSIS — Z9889 Other specified postprocedural states: Secondary | ICD-10-CM | POA: Diagnosis not present

## 2016-01-17 DIAGNOSIS — Z9181 History of falling: Secondary | ICD-10-CM | POA: Diagnosis not present

## 2016-01-17 DIAGNOSIS — Z792 Long term (current) use of antibiotics: Secondary | ICD-10-CM | POA: Diagnosis not present

## 2016-01-17 DIAGNOSIS — J189 Pneumonia, unspecified organism: Secondary | ICD-10-CM | POA: Diagnosis not present

## 2016-01-17 DIAGNOSIS — Z87891 Personal history of nicotine dependence: Secondary | ICD-10-CM | POA: Diagnosis not present

## 2016-01-17 DIAGNOSIS — Z9981 Dependence on supplemental oxygen: Secondary | ICD-10-CM | POA: Diagnosis not present

## 2016-01-17 DIAGNOSIS — Z7982 Long term (current) use of aspirin: Secondary | ICD-10-CM | POA: Diagnosis not present

## 2016-01-17 DIAGNOSIS — Z48813 Encounter for surgical aftercare following surgery on the respiratory system: Secondary | ICD-10-CM | POA: Diagnosis not present

## 2016-01-25 DIAGNOSIS — Z7982 Long term (current) use of aspirin: Secondary | ICD-10-CM | POA: Diagnosis not present

## 2016-01-25 DIAGNOSIS — Z9181 History of falling: Secondary | ICD-10-CM | POA: Diagnosis not present

## 2016-01-25 DIAGNOSIS — J189 Pneumonia, unspecified organism: Secondary | ICD-10-CM | POA: Diagnosis not present

## 2016-01-25 DIAGNOSIS — Z87891 Personal history of nicotine dependence: Secondary | ICD-10-CM | POA: Diagnosis not present

## 2016-01-25 DIAGNOSIS — Z48813 Encounter for surgical aftercare following surgery on the respiratory system: Secondary | ICD-10-CM | POA: Diagnosis not present

## 2016-01-25 DIAGNOSIS — Z9981 Dependence on supplemental oxygen: Secondary | ICD-10-CM | POA: Diagnosis not present

## 2016-01-25 DIAGNOSIS — Z792 Long term (current) use of antibiotics: Secondary | ICD-10-CM | POA: Diagnosis not present

## 2016-01-30 DIAGNOSIS — J449 Chronic obstructive pulmonary disease, unspecified: Secondary | ICD-10-CM | POA: Diagnosis not present

## 2016-02-01 DIAGNOSIS — Z87891 Personal history of nicotine dependence: Secondary | ICD-10-CM | POA: Diagnosis not present

## 2016-02-01 DIAGNOSIS — Z9981 Dependence on supplemental oxygen: Secondary | ICD-10-CM | POA: Diagnosis not present

## 2016-02-01 DIAGNOSIS — Z9181 History of falling: Secondary | ICD-10-CM | POA: Diagnosis not present

## 2016-02-01 DIAGNOSIS — J189 Pneumonia, unspecified organism: Secondary | ICD-10-CM | POA: Diagnosis not present

## 2016-02-01 DIAGNOSIS — Z7982 Long term (current) use of aspirin: Secondary | ICD-10-CM | POA: Diagnosis not present

## 2016-02-01 DIAGNOSIS — Z792 Long term (current) use of antibiotics: Secondary | ICD-10-CM | POA: Diagnosis not present

## 2016-02-01 DIAGNOSIS — Z48813 Encounter for surgical aftercare following surgery on the respiratory system: Secondary | ICD-10-CM | POA: Diagnosis not present

## 2016-02-20 DIAGNOSIS — R918 Other nonspecific abnormal finding of lung field: Secondary | ICD-10-CM | POA: Diagnosis not present

## 2016-02-20 DIAGNOSIS — R911 Solitary pulmonary nodule: Secondary | ICD-10-CM | POA: Diagnosis not present

## 2016-02-20 DIAGNOSIS — J189 Pneumonia, unspecified organism: Secondary | ICD-10-CM | POA: Diagnosis not present

## 2016-02-20 DIAGNOSIS — J90 Pleural effusion, not elsewhere classified: Secondary | ICD-10-CM | POA: Diagnosis not present

## 2016-02-20 DIAGNOSIS — J869 Pyothorax without fistula: Secondary | ICD-10-CM | POA: Diagnosis not present

## 2016-02-23 ENCOUNTER — Telehealth: Payer: Self-pay | Admitting: Pulmonary Disease

## 2016-02-23 DIAGNOSIS — J449 Chronic obstructive pulmonary disease, unspecified: Secondary | ICD-10-CM

## 2016-02-23 NOTE — Telephone Encounter (Signed)
Pt calling asking if we can call [facility] so they can pick up the oxgyen machine.  Please call

## 2016-02-23 NOTE — Telephone Encounter (Signed)
Spoke with pt and he is asking if we will [facility] to have his O2 picked up. States he doesn't need it. States after you did his surgery that [facility]. He says "you about killed him and he will take care of that with you". I have placed an order to have O2 picked up.

## 2016-02-26 NOTE — Telephone Encounter (Signed)
Interesting.. I didn't do any "surgery" on him. Oh well. O2 picked up > noted  Aaron Luna

## 2016-02-27 ENCOUNTER — Other Ambulatory Visit: Payer: Self-pay | Admitting: *Deleted

## 2016-02-27 MED ORDER — DILTIAZEM HCL ER COATED BEADS 120 MG PO CP24: 120.0000 mg | ORAL_CAPSULE | Freq: Every day | ORAL | Status: DC

## 2016-02-27 NOTE — Telephone Encounter (Signed)
Requested Prescriptions   Signed Prescriptions Disp Refills  . diltiazem (CARDIZEM CD) 120 MG 24 hr capsule 90 capsule 3    Sig: Take 1 capsule (120 mg total) by mouth daily.    Authorizing Provider: GOLLAN, TIMOTHY J    Ordering User: Maston Wight C    

## 2016-02-29 DIAGNOSIS — J449 Chronic obstructive pulmonary disease, unspecified: Secondary | ICD-10-CM | POA: Diagnosis not present

## 2016-03-28 ENCOUNTER — Telehealth: Payer: Self-pay | Admitting: Pulmonary Disease

## 2016-03-28 NOTE — Telephone Encounter (Signed)
Pt wanted to let us know his oxygen equipment has still not been picked up, and he is continuing to receive a bill from them. States that Dr. Sung AmabileSimonds office has to make the call and let them know he doesn't need it anymore.

## 2016-03-28 NOTE — Telephone Encounter (Signed)
D/C order placed on 02/23/16 to pick up O2. Pt states he is still getting billed and [facility] has not picked the equipment up. Thanks.

## 2016-03-29 NOTE — Telephone Encounter (Signed)
Called and spoke with Henderson NewcomerMelissa Stenson with [facility], [facility] but will need to find out why O2 hasn't been picked up yet.  Asked her to please check the status of this order and advise, b/c patient is still being billed for o2 that should have been picked up by now. Melissa will call us back to advise. Rhonda J Cobb

## 2016-04-04 NOTE — Telephone Encounter (Signed)
[facility].  [facility] patient, however, voice mail was full and was unable to leave a message for him.  [facility] Management is looking in to why this issue. Nothing else needed at this time. Rhonda J Cobb

## 2016-04-04 NOTE — Telephone Encounter (Signed)
Sent another message to Henderson Newcom[facility]Melissa [facility] about [facility] of [facility] for o2.  Waiting on response. Rhonda J Cobb

## 2016-06-23 IMAGING — CR DG CHEST 2V
1 series · 2 of 2 positions shown · non-contrast
Comparison: 08/01/2015

CLINICAL DATA: Pneumonia since [REDACTED].  Shortness of breath.

EXAM:
CHEST  2 VIEW

[Series 1: dg chest 2 view · 0.14mm/px · 2 of 2 slices shown]
[im 1/2]
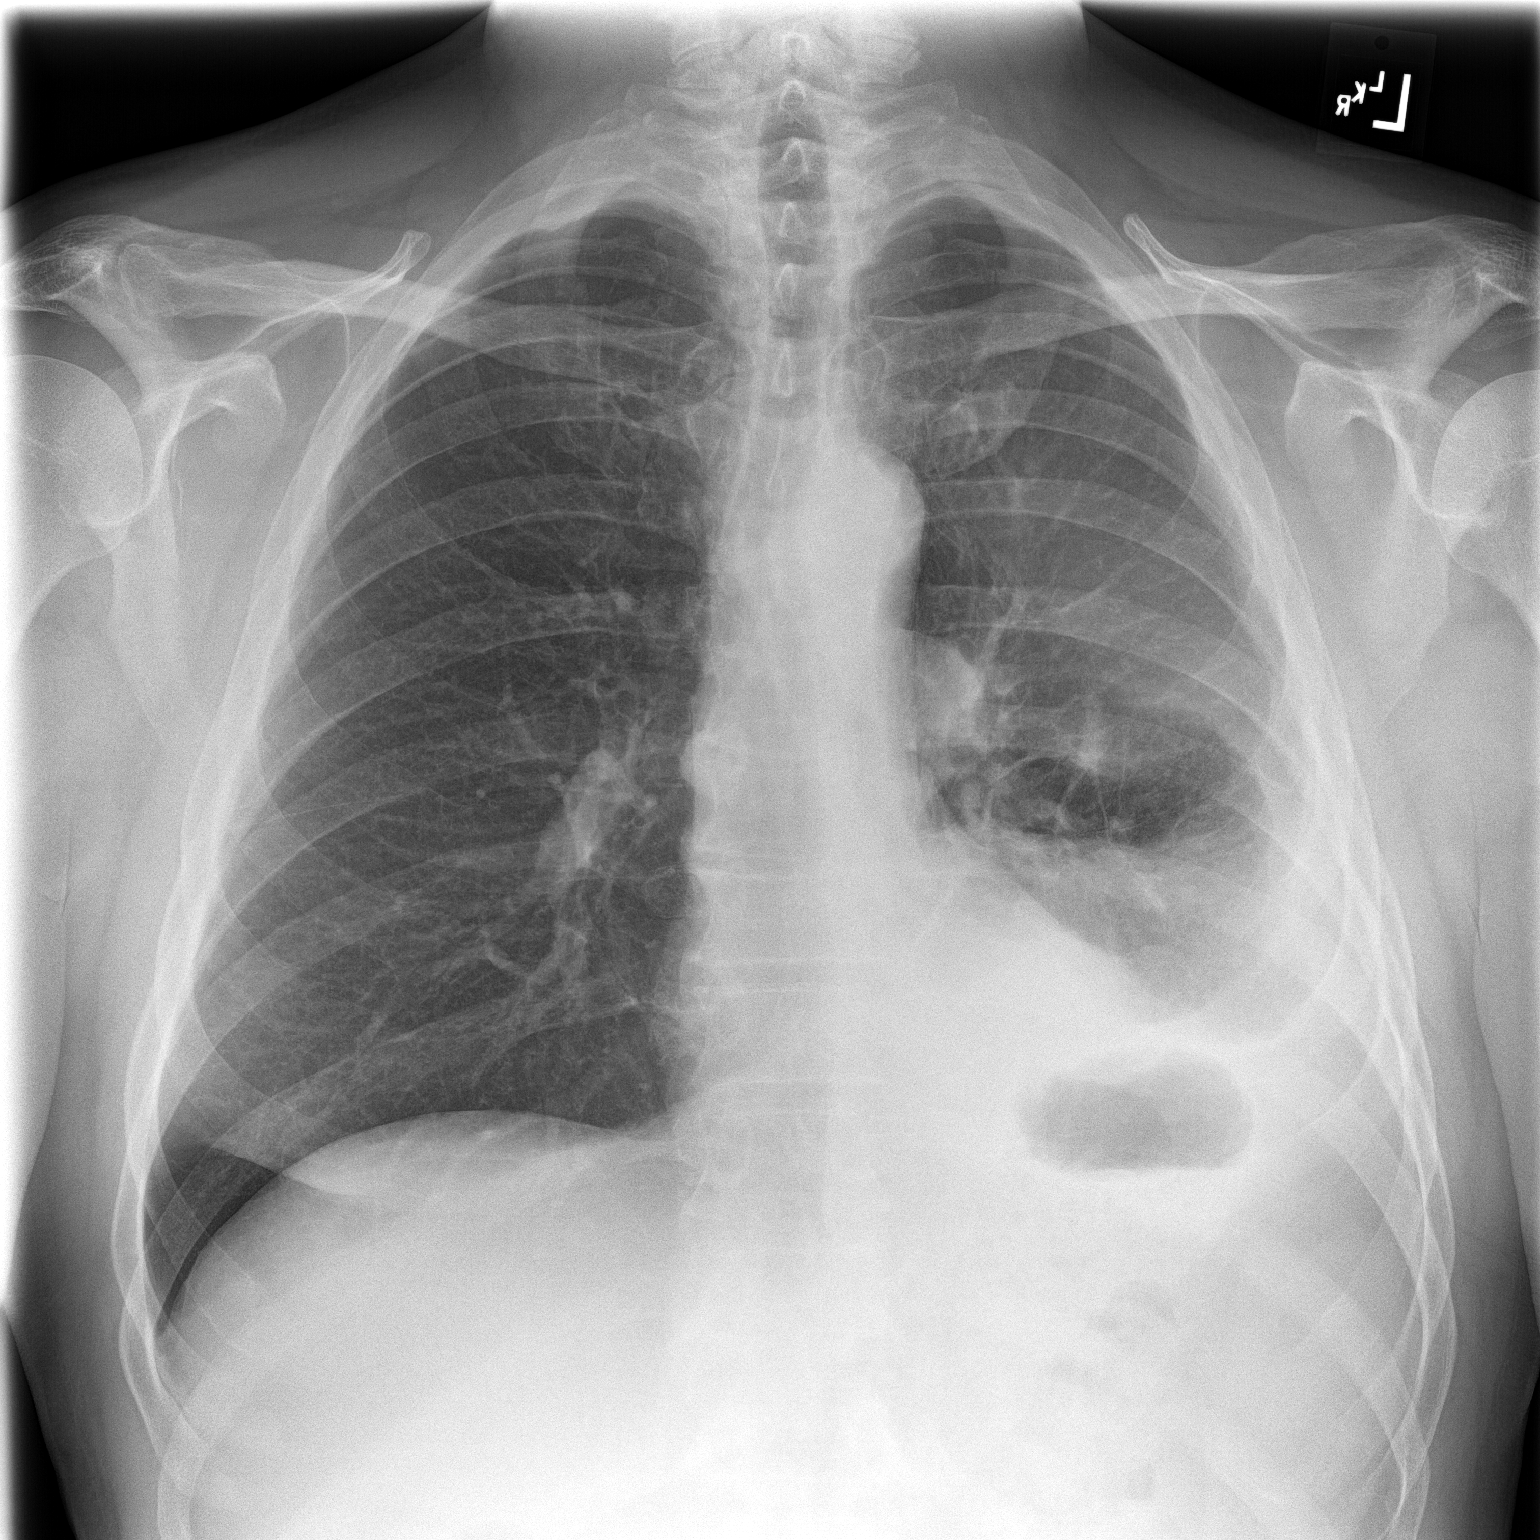
[im 2/2]
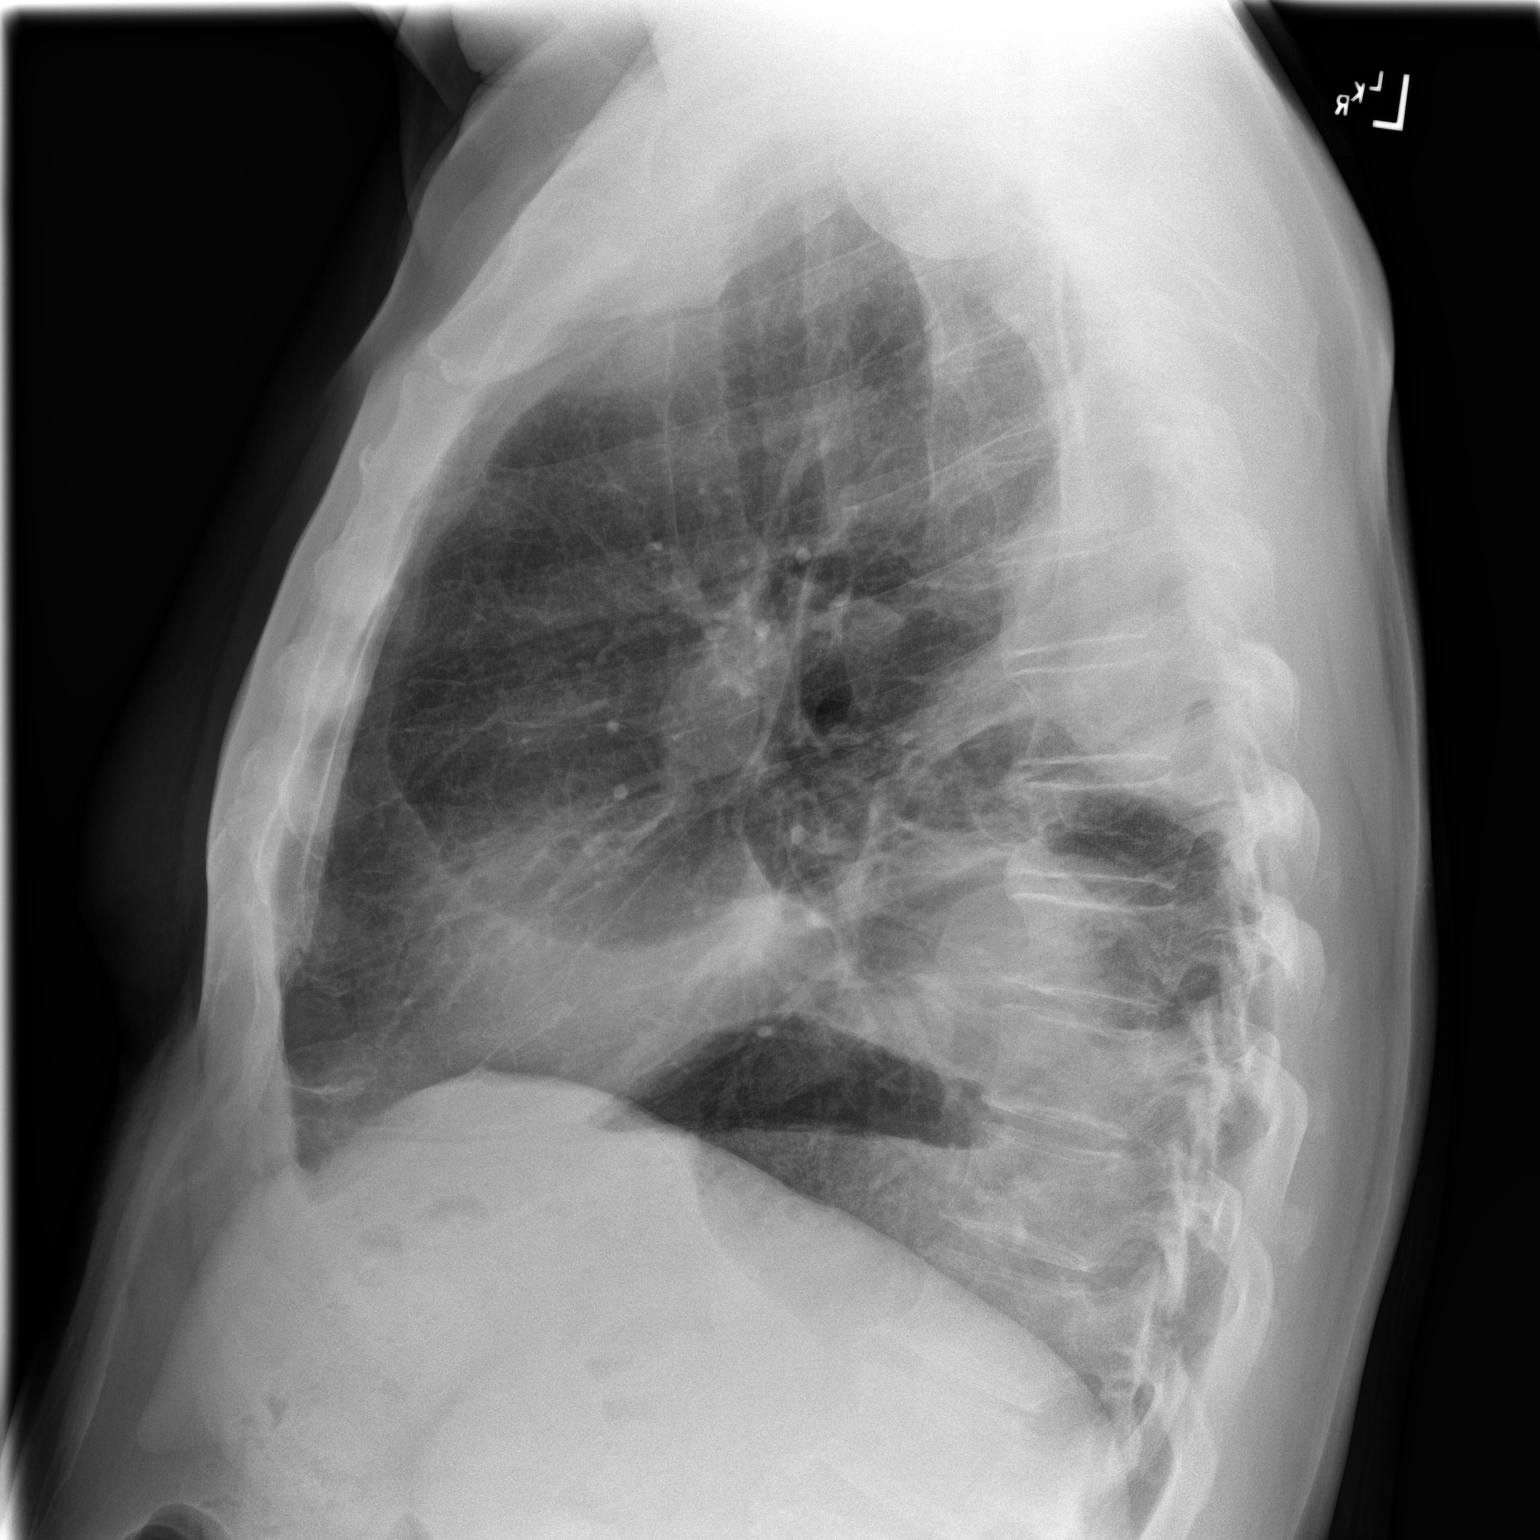

[2 of 2 positions shown; findings below may reference images not displayed]

FINDINGS: Moderate left pleural effusion with left lower lobe airspace
opacity. No real change since prior study. Right lung is clear.
Heart is normal size. No acute bony abnormality.
IMPRESSION: Continued moderate left pleural effusion with left lower lobe
airspace disease. No change since prior study.

## 2016-06-25 ENCOUNTER — Telehealth: Payer: Self-pay | Admitting: Cardiovascular Disease

## 2016-06-25 NOTE — Telephone Encounter (Signed)
3 attempts to schedule fu from recall list.  Deleting recall.  °

## 2016-07-22 ENCOUNTER — Other Ambulatory Visit: Payer: Self-pay | Admitting: Nurse Practitioner

## 2016-07-22 ENCOUNTER — Ambulatory Visit
Admission: RE | Admit: 2016-07-22 | Discharge: 2016-07-22 | Disposition: A | Discharge status: Home / Self Care | Payer: Medicare Other | Source: Ambulatory Visit | Attending: Nurse Practitioner | Admitting: Nurse Practitioner

## 2016-07-22 DIAGNOSIS — I10 Essential (primary) hypertension: Secondary | ICD-10-CM | POA: Diagnosis not present

## 2016-07-22 DIAGNOSIS — J984 Other disorders of lung: Secondary | ICD-10-CM | POA: Insufficient documentation

## 2016-07-22 DIAGNOSIS — R05 Cough: Secondary | ICD-10-CM

## 2016-07-22 DIAGNOSIS — R059 Cough, unspecified: Secondary | ICD-10-CM

## 2016-07-22 DIAGNOSIS — R0602 Shortness of breath: Secondary | ICD-10-CM

## 2016-07-22 DIAGNOSIS — J209 Acute bronchitis, unspecified: Secondary | ICD-10-CM | POA: Diagnosis not present

## 2016-08-06 IMAGING — CT CT CHEST W/ CM
1 series · 15 of 33 positions shown, 19 images · IV contrast (omnipaque)
Comparison: 09/17/2015, 07/29/2015 and 10/18/2012

CLINICAL DATA: Bilateral lower chest pain. History of pneumonia
July 2015 with continued chest pain, shortness of breath and
cold sweats. On home oxygen.

EXAM:
CT CHEST WITH CONTRAST
TECHNIQUE: Multidetector CT imaging of the chest was performed during
intravenous contrast administration.
CONTRAST:  75mL OMNIPAQUE IOHEXOL 350 MG/ML SOLN

[Series 2: axial st · axial · 0.81mm/px · z∈[-602,-338]mm · 15 of 63 slices shown, 19 images]
[im 5/63  mediastinal]
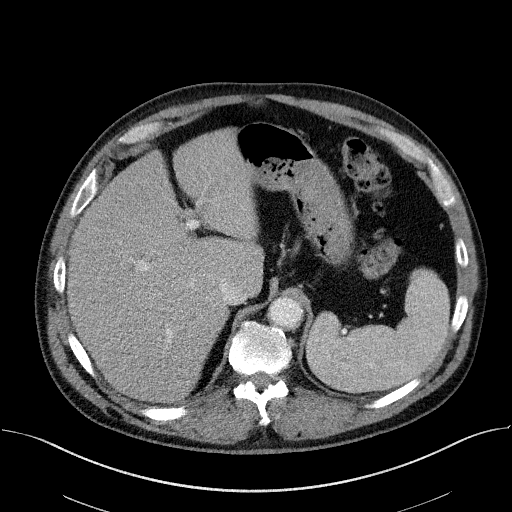
[im 5/63  lung]
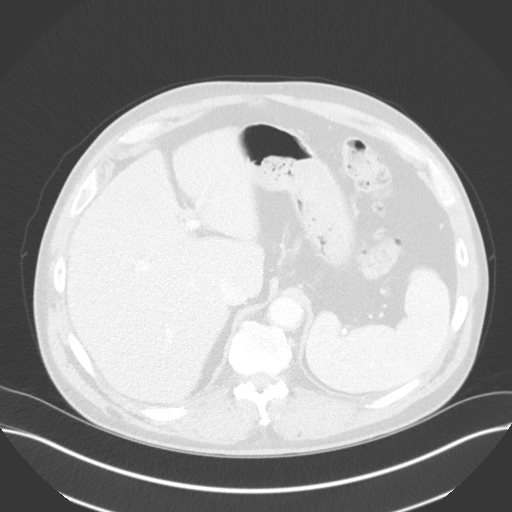
[im 10/63  lung]
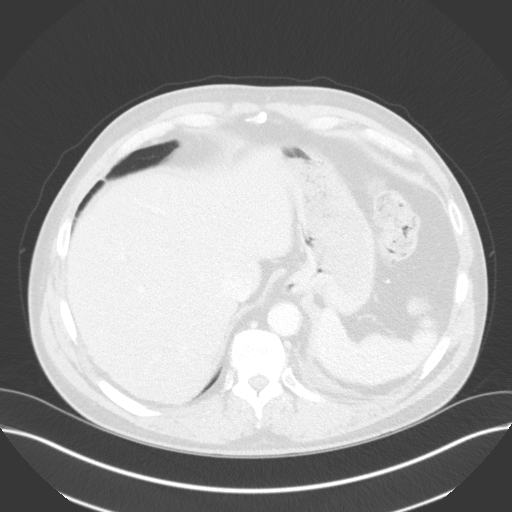
[im 13/63  lung]
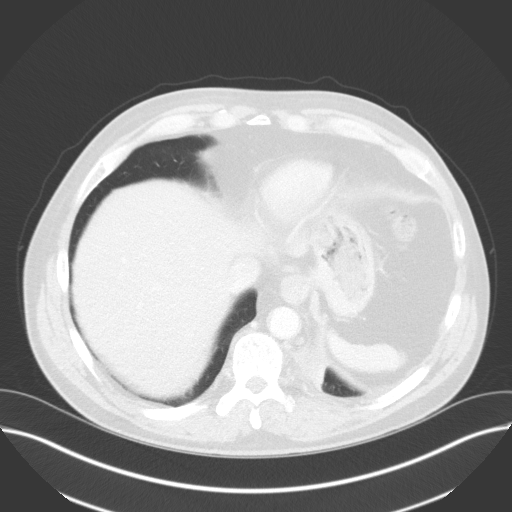
[im 17/63  lung]
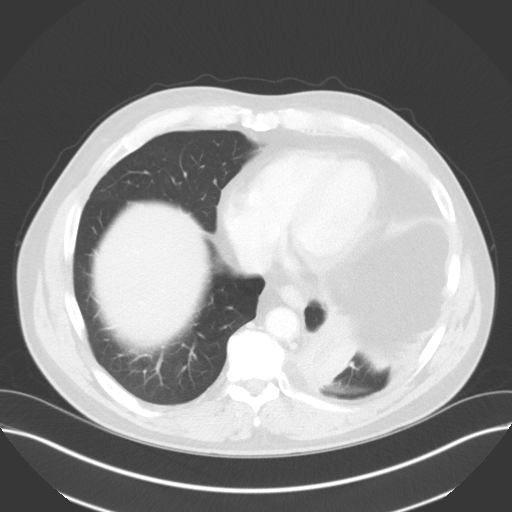
[im 21/63  mediastinal]
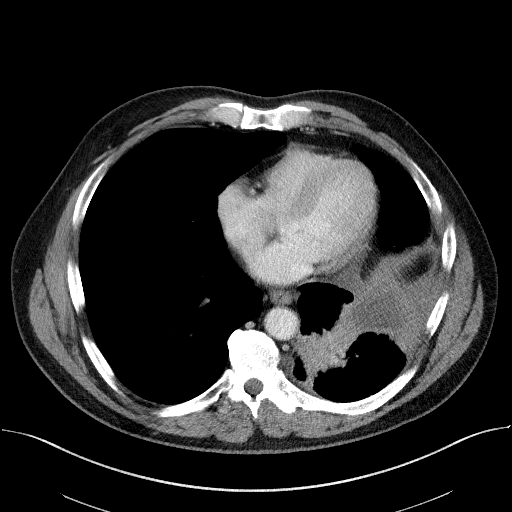
[im 21/63  lung]
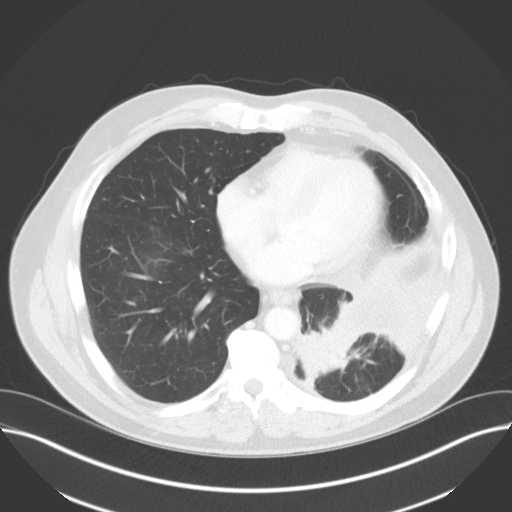
[im 25/63  lung]
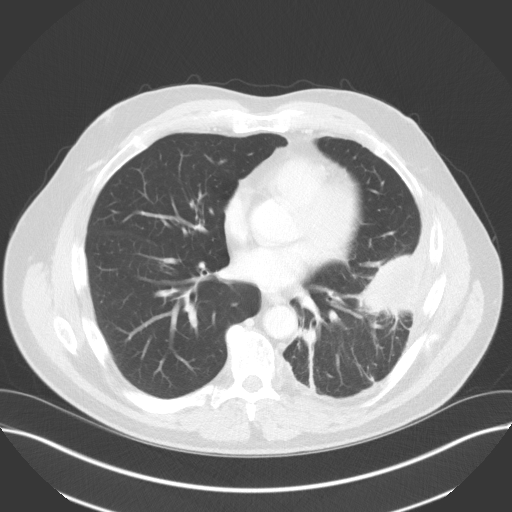
[im 28/63  lung]
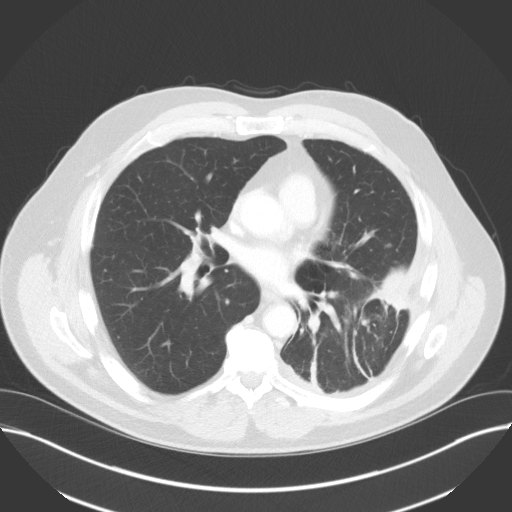
[im 33/63  lung]
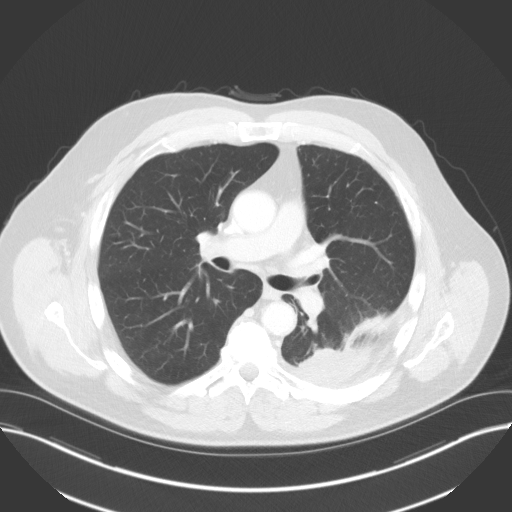
[im 35/63  mediastinal]
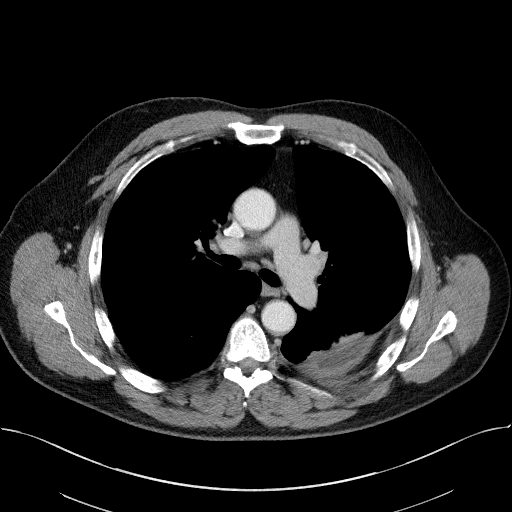
[im 35/63  lung]
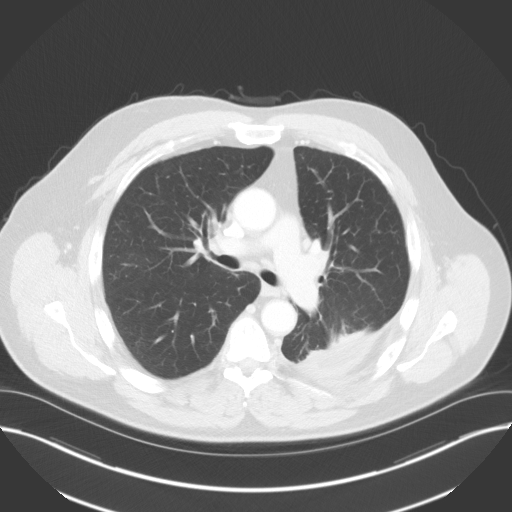
[im 38/63  lung]
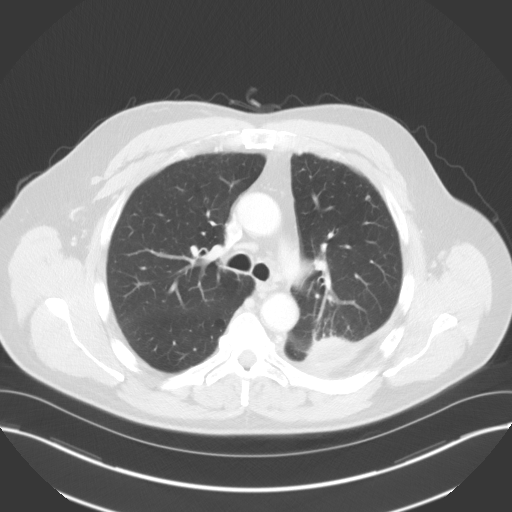
[im 42/63  lung]
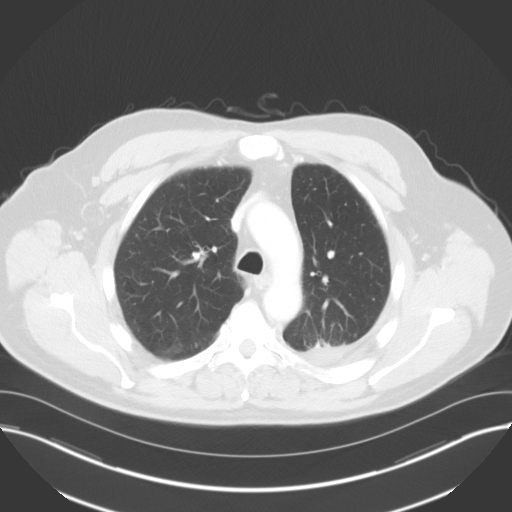
[im 46/63  lung]
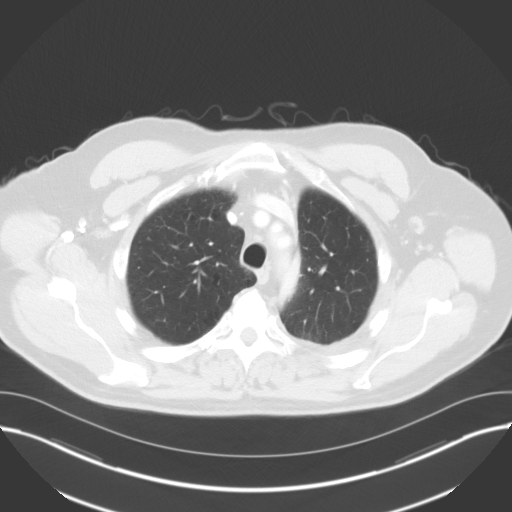
[im 50/63  mediastinal]
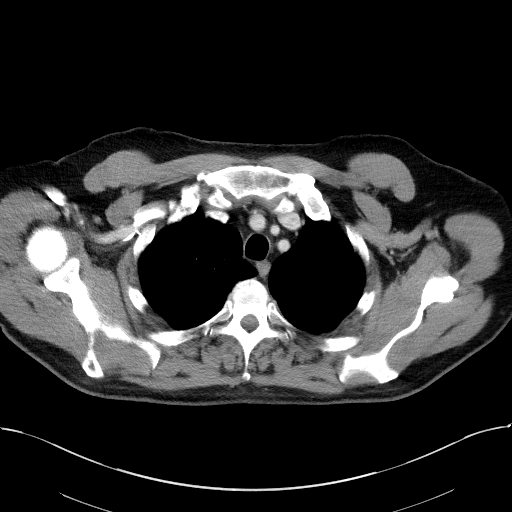
[im 50/63  lung]
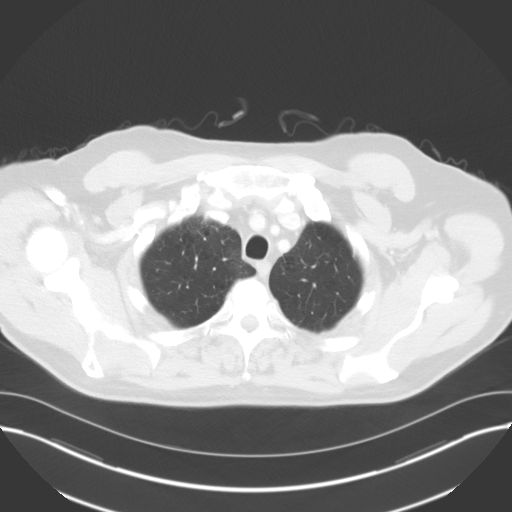
[im 53/63  lung]
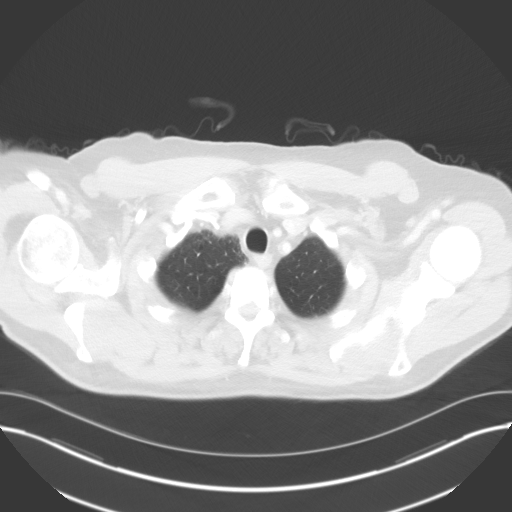
[im 58/63  lung]
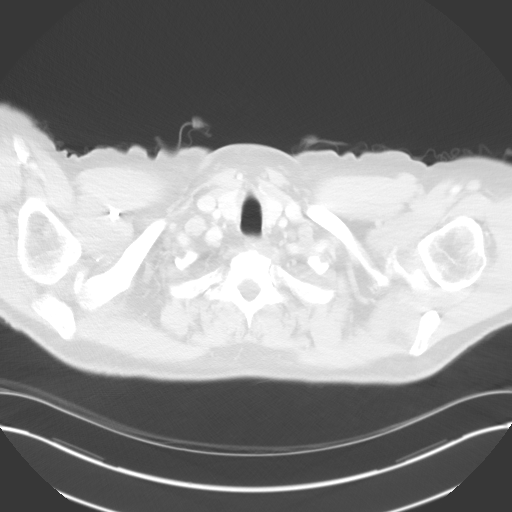

[15 of 33 positions shown; findings below may reference images not displayed]

FINDINGS: Lungs are adequately inflated demonstrate continued interval
decrease size of patient's left pleural effusion as the most
superior pocket now measures 1.2 cm in thickness (previously
cm). Less associated compressive atelectasis in the left lower lobe.
Stable small thin-walled lung cysts over the right upper lobe. A 3
mm nodule over the anterior left upper lobe stable since 0875. Three
small faint nodules over the lingula with the largest measuring 4
mm. Airways are normal.

Heart is normal in size. Remaining mediastinal structures are within
normal. There is no hilar, mediastinal or axillary adenopathy.
Stable focal prominence of the thyroid isthmus.

Images through the upper abdomen demonstrate no focal abnormality.

There are mild degenerate changes of the spine.
IMPRESSION: Continued interval improvement patient's small left pleural effusion
with most superior pocket 1.2 cm in thickness (previously 2.8 cm).
Less compressive atelectasis in the left base.

Three small faint nodules over the lingula with the largest
measuring 4 mm. Recommend followup chest CT 1 year.

Stable focal prominence of the thyroid isthmus.

## 2016-08-11 IMAGING — CR DG CHEST 2V
1 series · 2 of 2 positions shown · non-contrast
Comparison: Chest CT 10/11/2015.

CLINICAL DATA: Pneumonia July 2015. Symptoms returned beginning
[DATE]. Known left lower lobe pneumonia.

EXAM:
CHEST  2 VIEW

[Series 3: w chest pa · 0.14mm/px · 2 of 2 slices shown]
[im 1/2]
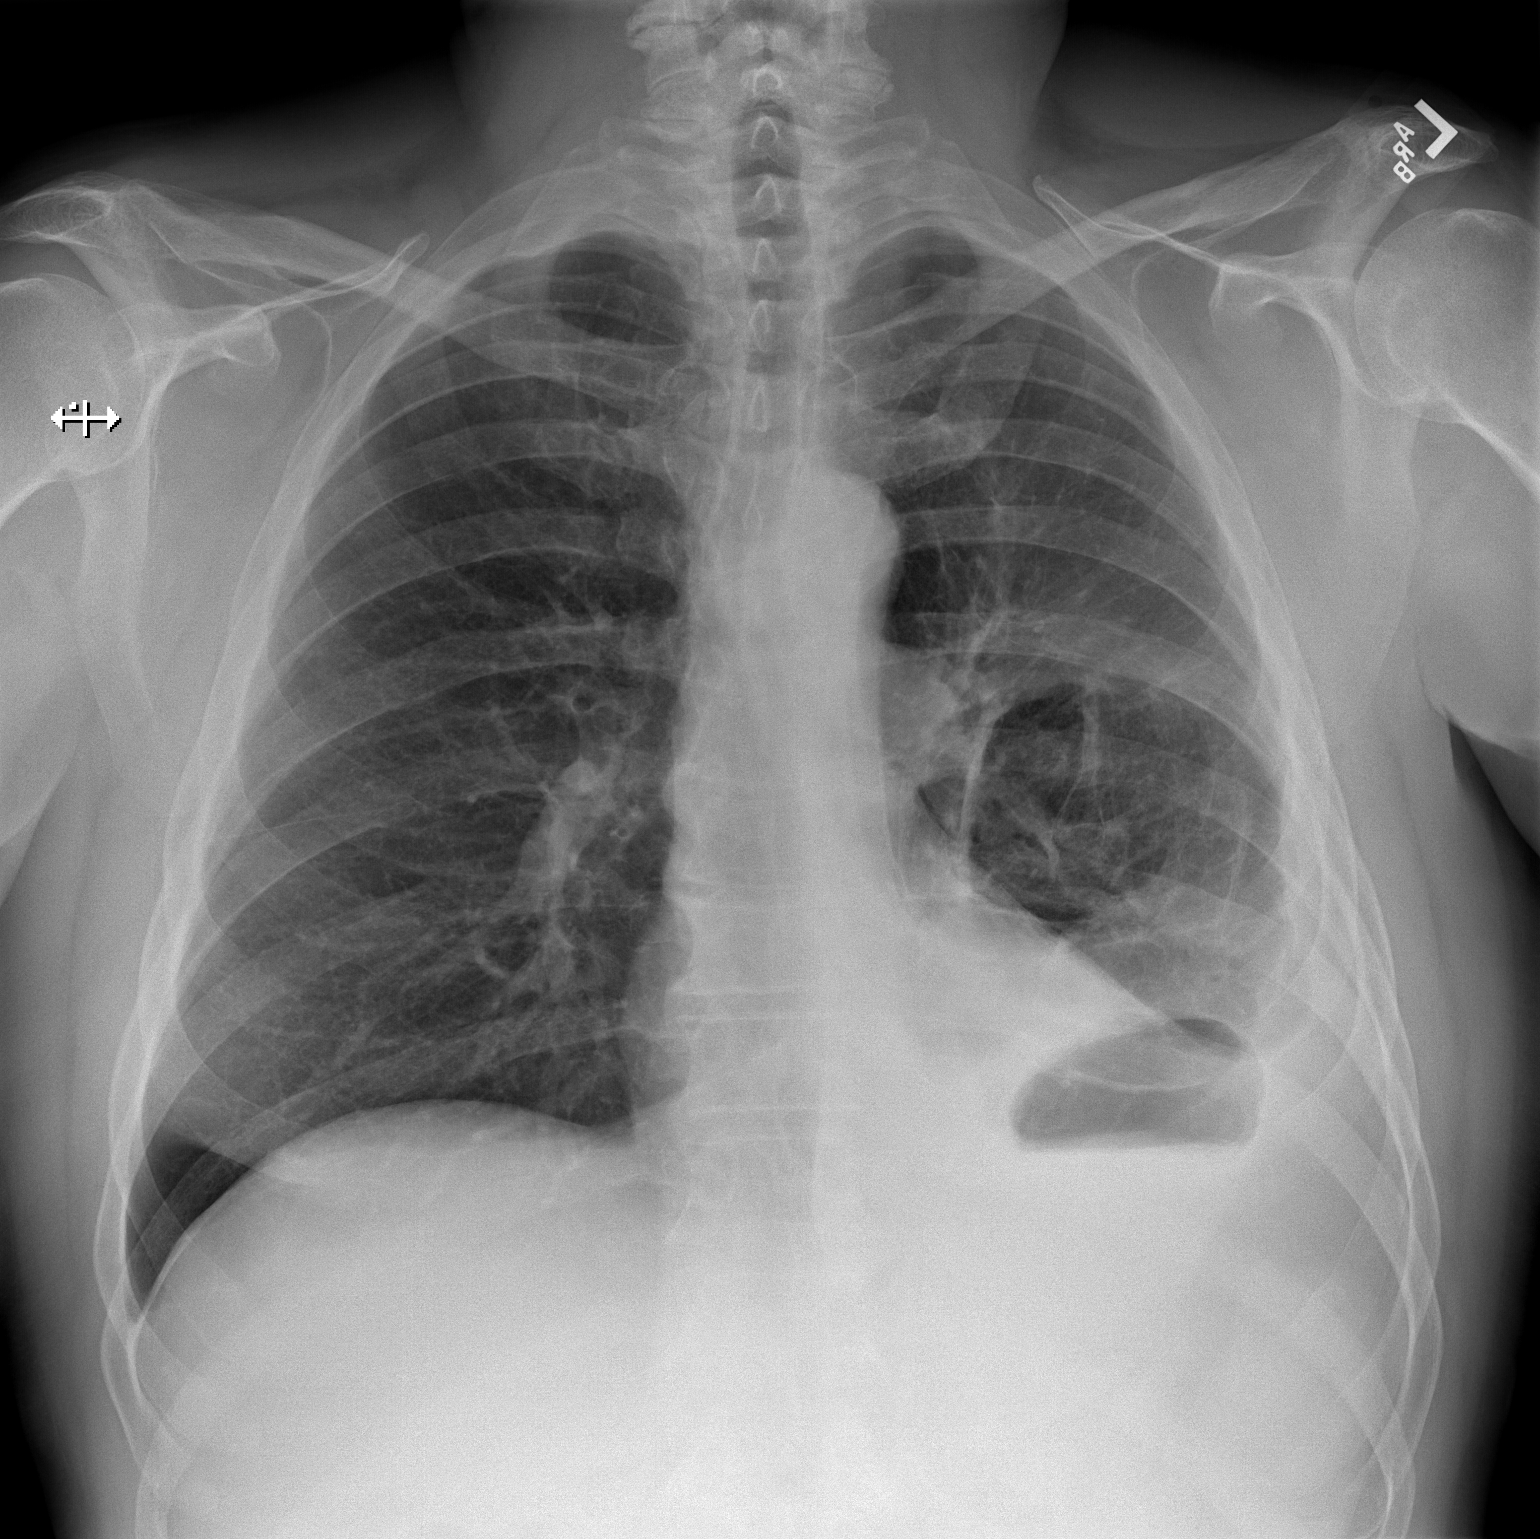
[im 2/2]
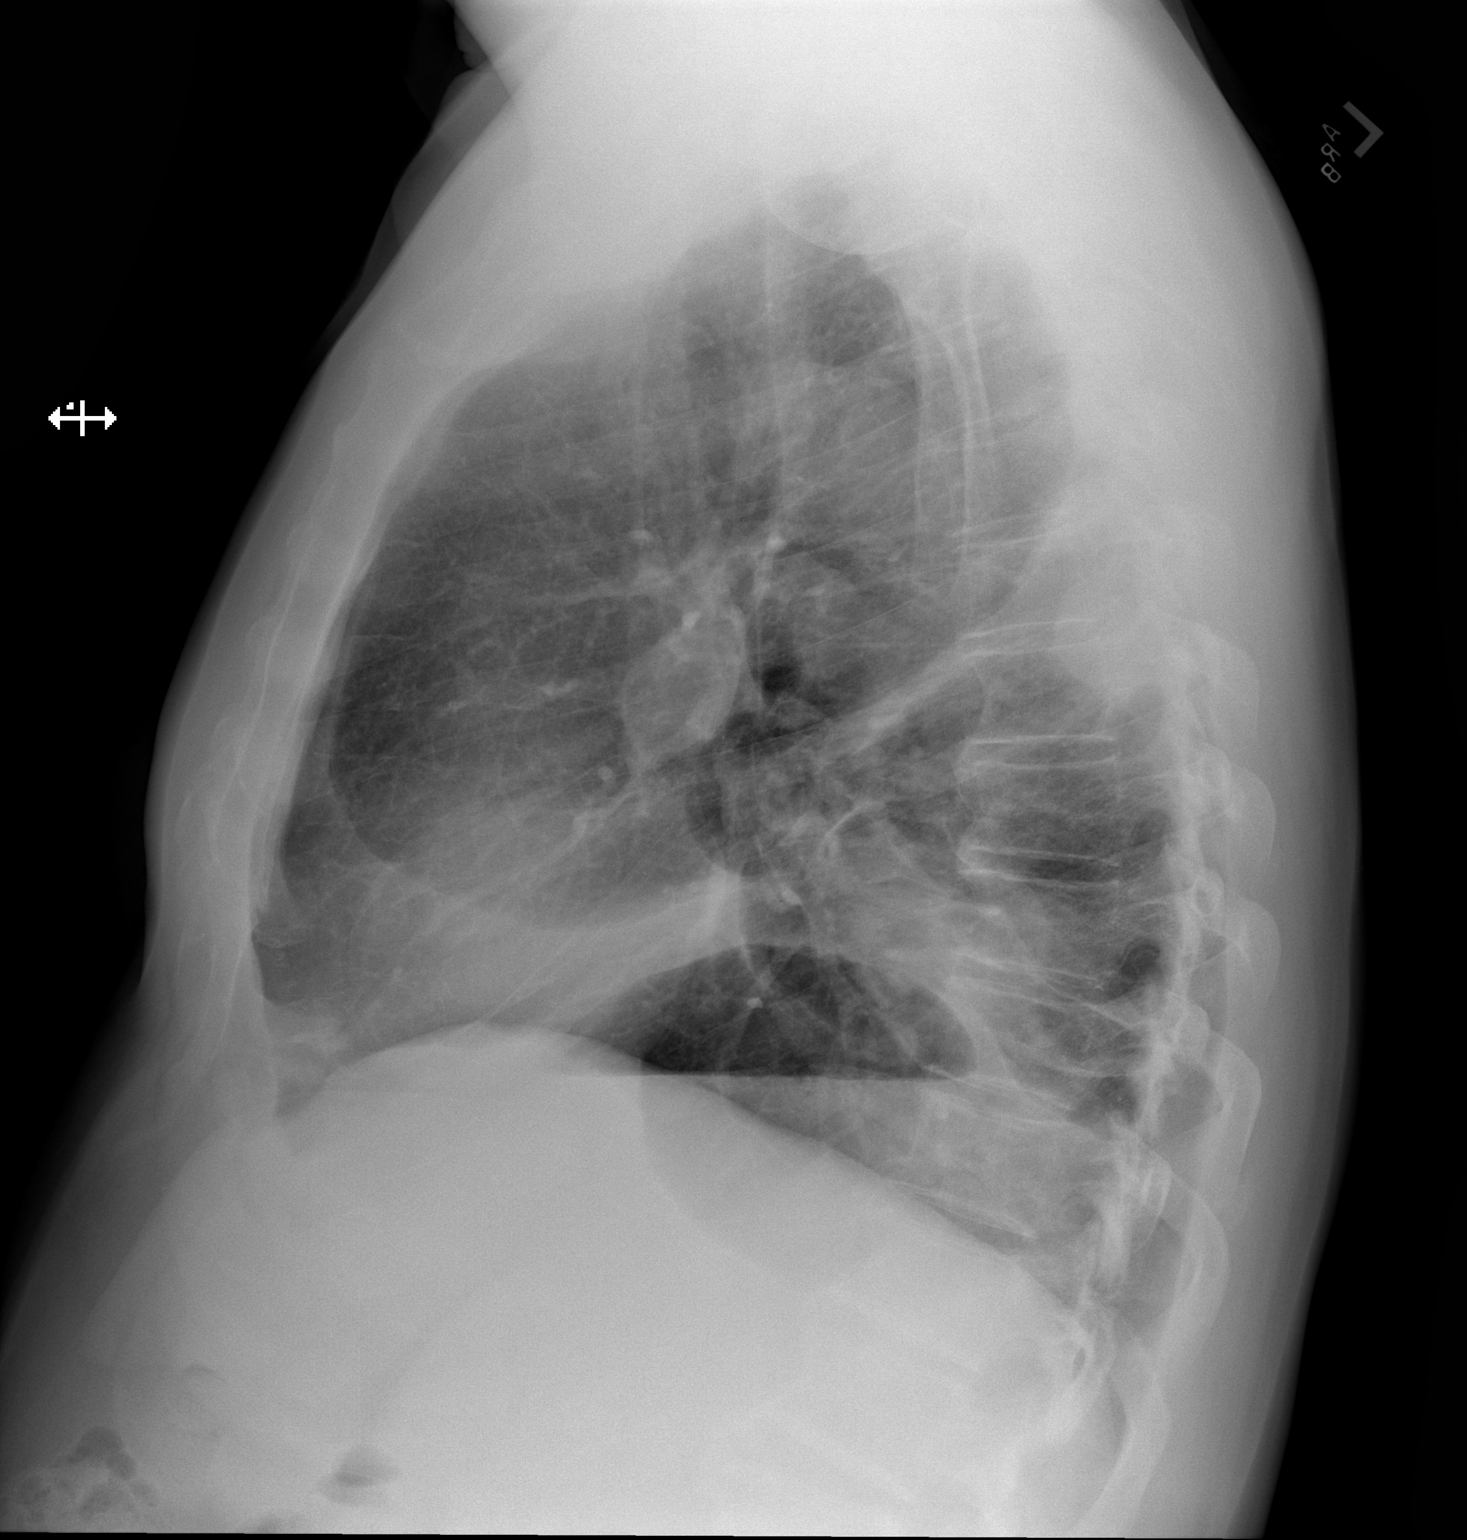

[2 of 2 positions shown; findings below may reference images not displayed]

FINDINGS: Airspace opacity in the left perihilar region and left lower lobe
compatible with pneumonia. Small left pleural effusion is stable.
Right lung is clear. Heart is normal size. No acute bony
abnormality.
IMPRESSION: Stable left lower lobe airspace opacity and small left effusion.

## 2016-10-04 DIAGNOSIS — I471 Supraventricular tachycardia: Secondary | ICD-10-CM | POA: Diagnosis not present

## 2016-10-04 DIAGNOSIS — Z0001 Encounter for general adult medical examination with abnormal findings: Secondary | ICD-10-CM | POA: Diagnosis not present

## 2016-10-04 DIAGNOSIS — I10 Essential (primary) hypertension: Secondary | ICD-10-CM | POA: Diagnosis not present

## 2016-10-04 DIAGNOSIS — J449 Chronic obstructive pulmonary disease, unspecified: Secondary | ICD-10-CM | POA: Diagnosis not present

## 2016-10-11 DIAGNOSIS — E782 Mixed hyperlipidemia: Secondary | ICD-10-CM | POA: Diagnosis not present

## 2016-10-11 DIAGNOSIS — Z1159 Encounter for screening for other viral diseases: Secondary | ICD-10-CM | POA: Diagnosis not present

## 2016-10-11 DIAGNOSIS — R7301 Impaired fasting glucose: Secondary | ICD-10-CM | POA: Diagnosis not present

## 2016-10-11 DIAGNOSIS — I10 Essential (primary) hypertension: Secondary | ICD-10-CM | POA: Diagnosis not present

## 2016-10-11 DIAGNOSIS — Z0001 Encounter for general adult medical examination with abnormal findings: Secondary | ICD-10-CM | POA: Diagnosis not present

## 2016-10-12 DIAGNOSIS — R7301 Impaired fasting glucose: Secondary | ICD-10-CM | POA: Diagnosis not present

## 2016-10-12 DIAGNOSIS — I10 Essential (primary) hypertension: Secondary | ICD-10-CM | POA: Diagnosis not present

## 2016-10-12 DIAGNOSIS — Z0001 Encounter for general adult medical examination with abnormal findings: Secondary | ICD-10-CM | POA: Diagnosis not present

## 2016-10-12 DIAGNOSIS — E782 Mixed hyperlipidemia: Secondary | ICD-10-CM | POA: Diagnosis not present

## 2016-10-12 DIAGNOSIS — Z1159 Encounter for screening for other viral diseases: Secondary | ICD-10-CM | POA: Diagnosis not present

## 2017-04-03 DIAGNOSIS — I10 Essential (primary) hypertension: Secondary | ICD-10-CM | POA: Diagnosis not present

## 2017-04-03 DIAGNOSIS — I471 Supraventricular tachycardia: Secondary | ICD-10-CM | POA: Diagnosis not present

## 2017-04-03 DIAGNOSIS — J449 Chronic obstructive pulmonary disease, unspecified: Secondary | ICD-10-CM | POA: Diagnosis not present

## 2017-08-07 DIAGNOSIS — H4312 Vitreous hemorrhage, left eye: Secondary | ICD-10-CM | POA: Diagnosis not present

## 2017-08-07 DIAGNOSIS — H43812 Vitreous degeneration, left eye: Secondary | ICD-10-CM | POA: Diagnosis not present

## 2017-08-08 ENCOUNTER — Other Ambulatory Visit: Payer: Self-pay | Admitting: Nurse Practitioner

## 2017-08-08 DIAGNOSIS — H4312 Vitreous hemorrhage, left eye: Secondary | ICD-10-CM | POA: Diagnosis not present

## 2017-08-08 DIAGNOSIS — J069 Acute upper respiratory infection, unspecified: Secondary | ICD-10-CM

## 2017-08-08 DIAGNOSIS — H3562 Retinal hemorrhage, left eye: Secondary | ICD-10-CM | POA: Diagnosis not present

## 2017-08-08 MED ORDER — AMOXICILLIN-POT CLAVULANATE 875-125 MG PO TABS: 1.0000 | ORAL_TABLET | Freq: Two times a day (BID) | ORAL | 0 refills | Status: DC

## 2017-08-08 NOTE — Progress Notes (Signed)
Sent augmentin 875mg  [facility] [place]

## 2017-08-27 DIAGNOSIS — Z87891 Personal history of nicotine dependence: Secondary | ICD-10-CM | POA: Diagnosis not present

## 2017-08-27 DIAGNOSIS — J9811 Atelectasis: Secondary | ICD-10-CM | POA: Diagnosis not present

## 2017-08-27 DIAGNOSIS — I471 Supraventricular tachycardia: Secondary | ICD-10-CM | POA: Diagnosis not present

## 2017-08-27 DIAGNOSIS — M62838 Other muscle spasm: Secondary | ICD-10-CM | POA: Diagnosis not present

## 2017-08-27 DIAGNOSIS — K5900 Constipation, unspecified: Secondary | ICD-10-CM | POA: Diagnosis not present

## 2017-08-27 DIAGNOSIS — J929 Pleural plaque without asbestos: Secondary | ICD-10-CM | POA: Diagnosis not present

## 2017-08-27 DIAGNOSIS — I10 Essential (primary) hypertension: Secondary | ICD-10-CM | POA: Diagnosis not present

## 2017-08-27 DIAGNOSIS — R918 Other nonspecific abnormal finding of lung field: Secondary | ICD-10-CM | POA: Diagnosis not present

## 2017-08-27 DIAGNOSIS — R42 Dizziness and giddiness: Secondary | ICD-10-CM | POA: Diagnosis not present

## 2017-08-27 DIAGNOSIS — J984 Other disorders of lung: Secondary | ICD-10-CM | POA: Diagnosis not present

## 2017-08-27 DIAGNOSIS — E041 Nontoxic single thyroid nodule: Secondary | ICD-10-CM | POA: Diagnosis not present

## 2017-08-27 DIAGNOSIS — R0602 Shortness of breath: Secondary | ICD-10-CM | POA: Diagnosis not present

## 2017-08-27 DIAGNOSIS — J189 Pneumonia, unspecified organism: Secondary | ICD-10-CM | POA: Diagnosis not present

## 2017-08-28 DIAGNOSIS — J181 Lobar pneumonia, unspecified organism: Secondary | ICD-10-CM | POA: Diagnosis not present

## 2017-08-28 DIAGNOSIS — R0602 Shortness of breath: Secondary | ICD-10-CM | POA: Insufficient documentation

## 2017-08-28 DIAGNOSIS — I10 Essential (primary) hypertension: Secondary | ICD-10-CM | POA: Diagnosis not present

## 2017-09-01 ENCOUNTER — Encounter: Payer: Self-pay | Admitting: Nurse Practitioner

## 2017-09-01 ENCOUNTER — Ambulatory Visit (INDEPENDENT_AMBULATORY_CARE_PROVIDER_SITE_OTHER): Payer: Medicare Other | Admitting: Nurse Practitioner

## 2017-09-01 VITALS — BP 114/68 | HR 90 | Resp 16 | Ht 74.0 in | Wt 220.2 lb

## 2017-09-01 DIAGNOSIS — R079 Chest pain, unspecified: Secondary | ICD-10-CM | POA: Diagnosis not present

## 2017-09-01 DIAGNOSIS — J440 Chronic obstructive pulmonary disease with acute lower respiratory infection: Secondary | ICD-10-CM | POA: Diagnosis not present

## 2017-09-01 DIAGNOSIS — R062 Wheezing: Secondary | ICD-10-CM | POA: Diagnosis not present

## 2017-09-01 DIAGNOSIS — J449 Chronic obstructive pulmonary disease, unspecified: Secondary | ICD-10-CM | POA: Insufficient documentation

## 2017-09-01 MED ORDER — LEVOFLOXACIN 500 MG PO TABS: 500.0000 mg | ORAL_TABLET | Freq: Every day | ORAL | 0 refills | Status: DC

## 2017-09-01 MED ORDER — ALBUTEROL SULFATE HFA 108 (90 BASE) MCG/ACT IN AERS: 2.0000 | INHALATION_SPRAY | RESPIRATORY_TRACT | 0 refills | Status: DC | PRN

## 2017-09-01 NOTE — Progress Notes (Signed)
[facility] [address] [place], [place] [address]  Internal MEDICINE  Office Visit Note  Patient Name: Aaron Luna  [id number]  [id number]  Date of Service: 09/01/2017     No chief complaint on file.    The patient was treated 2 weeks ago withamoxicillin.did get better. After he finished this, he felt better for a short period of time. Then, symptoms got much worse got very short of breath. Went to ER at [facility]. Was diagnosed with pneumonia. Kept overnight in 24 hour observation unit. Is on last day of levofloxacin.cough has improved a great deal. Still having some shortness of breath.    URI   This is a new problem. The current episode started 1 to 4 weeks ago. The problem has been gradually improving. Associated symptoms include chest pain, congestion, coughing, nausea, rhinorrhea, sinus pain, vomiting and wheezing. Pertinent negatives include no abdominal pain, diarrhea, dysuria, neck pain, rash, sneezing or sore throat. Treatments tried: patient was treated wit horal antibiotics. When he got worse after colpletion, was seen in ER at [facility]. diagnosed with pneumonia.  The treatment provided moderate relief.   Pt is here for recent hospital follow up.  Current Medication: Outpatient Encounter Medications as of 09/01/2017  Medication Sig  . albuterol (PROVENTIL HFA;VENTOLIN HFA) 108 (90 BASE) MCG/ACT inhaler Inhale 1-2 puffs into the lungs every 6 (six) hours as needed for wheezing or shortness of breath.  . cloNIDine (CATAPRES) 0.1 MG tablet Take 1 tablet (0.1 mg total) by mouth 2 (two) times daily. (Patient taking differently: Take 0.1 mg by mouth 2 (two) times daily. One tab in am and one tab in pm)  . ibuprofen (ADVIL,MOTRIN) 400 MG tablet Take 1 tablet (400 mg total) by mouth every 6 (six) hours as needed.  Marland Kitchen amoxicillin-clavulanate (AUGMENTIN) 875-125 MG tablet Take 1 tablet by mouth 2 (two) times daily. (Patient not taking: Reported on 09/01/2017)  . aspirin 81 MG  tablet Take 81 mg by mouth daily.  Marland Kitchen diltiazem (CARDIZEM CD) 120 MG 24 hr capsule Take 1 capsule (120 mg total) by mouth daily. (Patient not taking: Reported on 09/01/2017)  . fish oil-omega-3 fatty acids 1000 MG capsule Take 1 g by mouth daily.  . furosemide (LASIX) 40 MG tablet Take 40 mg by mouth daily.  Marland Kitchen umeclidinium-vilanterol (ANORO ELLIPTA) 62.5-25 MCG/INH AEPB Inhale 1 puff into the lungs daily. (Patient not taking: Reported on 09/01/2017)   No facility-administered encounter medications on file as of 09/01/2017.     Surgical History: Past Surgical History:  Procedure Laterality Date  . CHOLECYSTECTOMY    . TONSILLECTOMY      Medical History: Past Medical History:  Diagnosis Date  . Essential hypertension   . Hyperglycemia   . PSVT (paroxysmal supraventricular tachycardia) (HCC)    a. 10/2012-->coverted with adenosine in ED (6-->12mg );  b 10/2012 Neg MV, EF 59%; c. 10/2012 Echo: EF 55%, nl valves.  . Tobacco abuse     Family History: Family History  Problem Relation Age of Onset  . Heart disease Father 79       CABG x 4  . Hypertension Sister     Social History   Socioeconomic History  . Marital status: Married    Spouse name: Not on file  . Number of children: Not on file  . Years of education: Not on file  . Highest education level: Not on file  Social Needs  . Financial resource strain: Not on file  . Food insecurity -  worry: Not on file  . Food insecurity - inability: Not on file  . Transportation needs - medical: Not on file  . Transportation needs - non-medical: Not on file  Occupational History  . Not on file  Tobacco Use  . Smoking status: Light Tobacco Smoker    Types: Cigarettes  . Smokeless tobacco: Former NeurosurgeonUser    Quit date: 05/12/2015  Substance and Sexual Activity  . Alcohol use: Yes    Comment: ocassionally  . Drug use: No  . Sexual activity: Not on file  Other Topics Concern  . Not on file  Social History Narrative  . Not on file       Review of Systems  Constitutional: Positive for activity change and fatigue. Negative for chills and unexpected weight change.  HENT: Positive for congestion, postnasal drip, rhinorrhea and sinus pain. Negative for sneezing and sore throat.   Eyes: Negative for redness.  Respiratory: Positive for cough, chest tightness, shortness of breath and wheezing.   Cardiovascular: Positive for chest pain. Negative for palpitations.  Gastrointestinal: Positive for nausea and vomiting. Negative for abdominal pain, constipation and diarrhea.  Genitourinary: Negative for dysuria and frequency.  Musculoskeletal: Positive for arthralgias. Negative for back pain, joint swelling and neck pain.  Skin: Negative for rash.  Allergic/Immunologic: Positive for environmental allergies.  Neurological: Negative.  Negative for tremors and numbness.  Psychiatric/Behavioral: Negative.  Negative for behavioral problems (Depression), sleep disturbance and suicidal ideas. The patient is not nervous/anxious.    Today's Vitals   09/01/17 0848  BP: 114/68  Pulse: 90  Resp: 16  SpO2: 92%  Weight: 220 lb 3.2 oz (99.9 kg)  Height: 6\' 2"  (1.88 m)    Physical Exam  Constitutional: He is oriented to person, place, and time. He appears well-developed and well-nourished. No distress.  HENT:  Head: Normocephalic and atraumatic.  Mouth/Throat: Oropharynx is clear and moist. No oropharyngeal exudate.  Eyes: EOM are normal. Pupils are equal, round, and reactive to light.  Neck: Normal range of motion. Neck supple. No JVD present. No tracheal deviation present. No thyromegaly present.  Cardiovascular: Normal rate, regular rhythm and normal heart sounds. Exam reveals no gallop and no friction rub.  No murmur heard. Pulmonary/Chest: Accessory muscle usage present. Tachypnea noted. He is in respiratory distress. He has decreased breath sounds in the left lower field. He has no wheezes. He has rales in the left lower field.      He exhibits tenderness.  Abdominal: Soft. Bowel sounds are normal. There is no tenderness.  Musculoskeletal: Normal range of motion.  Lymphadenopathy:    He has no cervical adenopathy.  Neurological: He is alert and oriented to person, place, and time. No cranial nerve deficit.  Skin: Skin is warm and dry. He is not diaphoretic.  Psychiatric: He has a normal mood and affect. His behavior is normal. Judgment and thought content normal.  Nursing note and vitals reviewed.   Assessment/Plan:  1. Chronic obstructive pulmonary disease with lower respiratory infection (HCC - recent diagnosis with pneumonia.  - levofloxacin (LEVAQUIN) 500 MG tablet; Take 1 tablet (500 mg total) by mouth daily.  Dispense: 7 tablet; Refill: 0 - DG Chest 2 View; Future  2. Wheezing - albuterol (PROVENTIL HFA;VENTOLIN HFA) 108 (90 Base) MCG/ACT inhaler; Inhale 2 puffs into the lungs every 4 (four) hours as needed for wheezing or shortness of breath.  Dispense: 1 Inhaler; Refill: 0  3. Chest pain, unspecified type - ECHOCARDIOGRAM COMPLETE; Future  General Counseling: Corwin LevinsJoseph verbalizes  understanding of the findings of todays visit and agrees with plan of treatment. I have discussed any further diagnostic evaluation that may be needed or ordered today. We also reviewed his medications today. he has been encouraged to call the office with any questions or concerns that should arise related to todays visit.   I have reviewed all medical records from hospital follow up including radiology reports and consults from other physicians. Appropriate follow up diagnostics will be scheduled as needed. Patient/ Family understands the plan of treatment.   Time spent 20 minutes.   Dr Lyndon Code, MD Internal Medicine

## 2017-09-08 ENCOUNTER — Ambulatory Visit: Payer: Medicare Other

## 2017-09-08 DIAGNOSIS — R079 Chest pain, unspecified: Secondary | ICD-10-CM | POA: Diagnosis not present

## 2017-09-08 DIAGNOSIS — R0602 Shortness of breath: Secondary | ICD-10-CM | POA: Diagnosis not present

## 2017-09-15 ENCOUNTER — Ambulatory Visit
Admission: RE | Admit: 2017-09-15 | Discharge: 2017-09-15 | Disposition: A | Discharge status: Home / Self Care | Payer: Medicare Other | Source: Ambulatory Visit | Attending: Nurse Practitioner | Admitting: Nurse Practitioner

## 2017-09-15 DIAGNOSIS — J440 Chronic obstructive pulmonary disease with acute lower respiratory infection: Secondary | ICD-10-CM

## 2017-09-15 DIAGNOSIS — Z8701 Personal history of pneumonia (recurrent): Secondary | ICD-10-CM | POA: Diagnosis not present

## 2017-09-15 DIAGNOSIS — R918 Other nonspecific abnormal finding of lung field: Secondary | ICD-10-CM | POA: Insufficient documentation

## 2017-09-15 DIAGNOSIS — J189 Pneumonia, unspecified organism: Secondary | ICD-10-CM | POA: Diagnosis not present

## 2017-10-03 ENCOUNTER — Encounter: Payer: Self-pay | Admitting: Nurse Practitioner

## 2017-10-03 ENCOUNTER — Ambulatory Visit: Payer: Medicare Other | Admitting: Nurse Practitioner

## 2017-10-03 VITALS — BP 152/86 | HR 85 | Resp 16 | Ht 74.0 in | Wt 229.0 lb

## 2017-10-03 DIAGNOSIS — R609 Edema, unspecified: Secondary | ICD-10-CM | POA: Diagnosis not present

## 2017-10-03 DIAGNOSIS — Z0001 Encounter for general adult medical examination with abnormal findings: Secondary | ICD-10-CM | POA: Diagnosis not present

## 2017-10-03 DIAGNOSIS — J449 Chronic obstructive pulmonary disease, unspecified: Secondary | ICD-10-CM | POA: Diagnosis not present

## 2017-10-03 DIAGNOSIS — I10 Essential (primary) hypertension: Secondary | ICD-10-CM | POA: Diagnosis not present

## 2017-10-03 DIAGNOSIS — R300 Dysuria: Secondary | ICD-10-CM | POA: Diagnosis not present

## 2017-10-03 MED ORDER — CLONIDINE HCL 0.1 MG PO TABS: 0.1000 mg | ORAL_TABLET | Freq: Two times a day (BID) | ORAL | 5 refills | Status: DC

## 2017-10-03 NOTE — Progress Notes (Signed)
[facility] [address] [place], [place] [address]  Internal MEDICINE  Office Visit Note  Patient Name: Aaron Luna  [id number]  [id number]  Date of Service: 10/15/2017  Chief Complaint  Patient presents with  . Annual Exam  . COPD     COPD  He complains of chest tightness, shortness of breath and wheezing. There is no cough. This is a chronic problem. The current episode started more than 1 year ago. The problem occurs constantly. The problem has been unchanged. Associated symptoms include appetite change, chest pain, heartburn, postnasal drip and rhinorrhea. Pertinent negatives include no headaches, sneezing or sore throat. His symptoms are aggravated by eating. His symptoms are not alleviated by steroid inhaler. 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   Pt is here for routine health maintenance examination  Current Medication: Outpatient Encounter Medications as of 10/03/2017  Medication Sig  . albuterol (PROVENTIL HFA;VENTOLIN HFA) 108 (90 Base) MCG/ACT inhaler Inhale 2 puffs into the lungs every 4 (four) hours as needed for wheezing or shortness of breath.  Marland Kitchen. amoxicillin-clavulanate (AUGMENTIN) 875-125 MG tablet Take 1 tablet by mouth 2 (two) times daily. (Patient not taking: Reported on 09/01/2017)  . aspirin 81 MG tablet Take 81 mg by mouth daily.  . cloNIDine (CATAPRES) 0.1 MG tablet Take 1 tablet (0.1 mg total) by mouth 2 (two) times daily.  Marland Kitchen. diltiazem (CARDIZEM CD) 120 MG 24 hr capsule Take 1 capsule (120 mg total) by mouth daily. (Patient not taking: Reported on 09/01/2017)  . fish oil-omega-3 fatty acids 1000 MG capsule Take 1 g by mouth daily.  . furosemide (LASIX) 40 MG tablet Take 40 mg by mouth daily.  Marland Kitchen. ibuprofen (ADVIL,MOTRIN) 400 MG tablet Take 1 tablet (400 mg total) by mouth every 6 (six) hours as needed.  Marland Kitchen. levofloxacin (LEVAQUIN) 500 MG tablet Take 1 tablet (500 mg total) by mouth  daily.  Marland Kitchen. umeclidinium-vilanterol (ANORO ELLIPTA) 62.5-25 MCG/INH AEPB Inhale 1 puff into the lungs daily. (Patient not taking: Reported on 09/01/2017)  . [DISCONTINUED] cloNIDine (CATAPRES) 0.1 MG tablet Take 1 tablet (0.1 mg total) by mouth 2 (two) times daily. (Patient taking differently: Take 0.1 mg by mouth 2 (two) times daily. One tab in am and one tab in pm)   No facility-administered encounter medications on file as of 10/03/2017.     Surgical History: Past Surgical History:  Procedure Laterality Date  . CHOLECYSTECTOMY    . TONSILLECTOMY      Medical History: Past Medical History:  Diagnosis Date  . Essential hypertension   . Hyperglycemia   . PSVT (paroxysmal supraventricular tachycardia) (HCC)    a. 10/2012-->coverted with adenosine in ED (6-->12mg );  b 10/2012 Neg MV, EF 59%; c. 10/2012 Echo: EF 55%, nl valves.  . Tobacco abuse     Family History: Family History  Problem Relation Age of Onset  . Heart disease Father 3656       CABG x 4  . Hypertension Sister       Review of Systems  Constitutional: Positive for activity change, appetite change and fatigue. Negative for chills and unexpected weight change.       Improved symptoms from his last visit.   HENT: Positive for congestion, postnasal drip, rhinorrhea and sinus pain. Negative for sneezing and sore throat.   Eyes: Negative.  Negative for redness.  Respiratory: Positive for chest tightness, shortness of breath and wheezing. Negative for cough.   Cardiovascular:  Positive for chest pain. Negative for palpitations.  Gastrointestinal: Positive for heartburn. Negative for abdominal pain, constipation, diarrhea, nausea and vomiting.  Endocrine: Negative for cold intolerance, heat intolerance, polydipsia, polyphagia and polyuria.  Genitourinary: Negative for dysuria and frequency.  Musculoskeletal: Positive for arthralgias. Negative for back pain, joint swelling and neck pain.  Skin: Negative for rash.    Allergic/Immunologic: Positive for environmental allergies.  Neurological: Negative for tremors, numbness and headaches.  Psychiatric/Behavioral: Negative for behavioral problems (Depression), sleep disturbance and suicidal ideas.     Today's Vitals   10/03/17 1035  BP: (!) 152/86  Pulse: 85  Resp: 16  SpO2: 92%  Weight: 229 lb (103.9 kg)  Height: 6\' 2"  (1.88 m)     Physical Exam  Constitutional: He is oriented to person, place, and time. He appears well-developed and well-nourished. No distress.  HENT:  Head: Normocephalic and atraumatic.  Mouth/Throat: Oropharynx is clear and moist. No oropharyngeal exudate.  Eyes: EOM are normal. Pupils are equal, round, and reactive to light.  Neck: Normal range of motion. Neck supple. No JVD present. Carotid bruit is not present. No tracheal deviation present. No thyromegaly present.  Cardiovascular: Normal rate, regular rhythm and intact distal pulses. Exam reveals no gallop and no friction rub.  No murmur heard. Heart rhythm slightly irregular   Pulmonary/Chest: Effort normal. Tachypnea noted. No respiratory distress. He has decreased breath sounds in the left lower field. He has no rales.     He exhibits tenderness.  Abdominal: Soft. Bowel sounds are normal. There is no tenderness.  Musculoskeletal: Normal range of motion.  Lymphadenopathy:    He has no cervical adenopathy.  Neurological: He is alert and oriented to person, place, and time. No cranial nerve deficit.  Skin: Skin is warm and dry. He is not diaphoretic.  Psychiatric: He has a normal mood and affect. His behavior is normal. Judgment and thought content normal.  Nursing note and vitals reviewed.   Assessment/Plan: 1. Encounter for general adult medical examination with abnormal findings Annual wellness visit today  2. Essential hypertension Stable. Continue clonidine as prescribed.  - cloNIDine (CATAPRES) 0.1 MG tablet; Take 1 tablet (0.1 mg total) by mouth 2  (two) times daily.  Dispense: 60 tablet; Refill: 5  3. Dysuria - Urinalysis, Routine w reflex microscopic  4. Chronic obstructive pulmonary disease, unspecified COPD type (HCC) Encouraged patient to use rescue inhaler as needed and as prescribed.   5. Edema, unspecified type Improved. Continue to monitor.   General Counseling: jeshawn melucci understanding of the findings of todays visit and agrees with plan of treatment. I have discussed any further diagnostic evaluation that may be needed or ordered today. We also reviewed his medications today. he has been encouraged to call the office with any questions or concerns that should arise related to todays visit.   Hypertension Counseling:   The following hypertensive lifestyle modification were recommended and discussed:  1. Limiting alcohol intake to less than 1 oz/day of ethanol:(24 oz of beer or 8 oz of wine or 2 oz of 100-proof whiskey). 2. Take baby ASA 81 mg daily. 3. Importance of regular aerobic exercise and losing weight. 4. Reduce dietary saturated fat and cholesterol intake for overall cardiovascular health. 5. Maintaining adequate dietary potassium, calcium, and magnesium intake. 6. Regular monitoring of the blood pressure. 7. Reduce sodium intake to less than 100 mmol/day (less than 2.3 gm of sodium or less than 6 gm of sodium choride)     Orders Placed This Encounter  Procedures  . Urinalysis, Routine w reflex microscopic    Meds ordered this encounter  Medications  . cloNIDine (CATAPRES) 0.1 MG tablet    Sig: Take 1 tablet (0.1 mg total) by mouth 2 (two) times daily.    Dispense:  60 tablet    Refill:  5    Pt needs to contact office to schedule future appointment and refill, Thank you.    Order Specific Question:   Supervising Provider    Answer:   Lyndon Code [[id number]]    Time spent: 73 Minutes      Lyndon Code, MD  Internal Medicine

## 2017-10-04 LAB — URINALYSIS, ROUTINE W REFLEX MICROSCOPIC
Bilirubin, UA: NEGATIVE
GLUCOSE, UA: NEGATIVE
Ketones, UA: NEGATIVE
LEUKOCYTES UA: NEGATIVE
Nitrite, UA: NEGATIVE
PROTEIN UA: NEGATIVE
RBC, UA: NEGATIVE
Specific Gravity, UA: 1.017 (ref 1.005–1.030)
Urobilinogen, Ur: 0.2 mg/dL (ref 0.2–1.0)
pH, UA: 5 (ref 5.0–7.5)

## 2017-10-14 ENCOUNTER — Ambulatory Visit: Payer: Self-pay | Admitting: Nurse Practitioner

## 2017-11-04 DIAGNOSIS — R05 Cough: Secondary | ICD-10-CM | POA: Diagnosis not present

## 2017-11-04 DIAGNOSIS — R0602 Shortness of breath: Secondary | ICD-10-CM | POA: Diagnosis not present

## 2017-11-04 DIAGNOSIS — J209 Acute bronchitis, unspecified: Secondary | ICD-10-CM | POA: Diagnosis not present

## 2017-11-13 ENCOUNTER — Encounter: Payer: Self-pay | Admitting: Internal Medicine

## 2017-11-13 ENCOUNTER — Ambulatory Visit: Payer: Medicare Other | Admitting: Internal Medicine

## 2017-11-13 VITALS — BP 140/78 | HR 99 | Resp 22 | Ht 74.0 in | Wt 213.0 lb

## 2017-11-13 DIAGNOSIS — R69 Illness, unspecified: Secondary | ICD-10-CM

## 2017-11-13 DIAGNOSIS — J869 Pyothorax without fistula: Secondary | ICD-10-CM

## 2017-11-13 DIAGNOSIS — R0602 Shortness of breath: Secondary | ICD-10-CM | POA: Diagnosis not present

## 2017-11-13 DIAGNOSIS — J449 Chronic obstructive pulmonary disease, unspecified: Secondary | ICD-10-CM | POA: Diagnosis not present

## 2017-11-13 DIAGNOSIS — T753XXA Motion sickness, initial encounter: Secondary | ICD-10-CM

## 2017-11-13 MED ORDER — TIOTROPIUM BROMIDE-OLODATEROL 2.5-2.5 MCG/ACT IN AERS: 1.0000 | INHALATION_SPRAY | Freq: Every day | RESPIRATORY_TRACT | 4 refills | Status: DC

## 2017-11-13 NOTE — Progress Notes (Signed)
[facility] [address] [place], [place] [address]  Pulmonary Sleep Medicine   Office Visit Note  Patient Name: Aaron AriasJoseph W Kilgour DOB: 08/24/1950 MRN [id number]  Date of Service: 11/13/2017  Complaints/HPI:  Patient is here for evaluation of shortness of breath.  States that he recently traveled down to [place] got sick was seen in an urgent care and was given antibiotics.  He is not able to take steroids because of an allergic reaction.  So he is only on 1 inhaler at this time.  Patient states he takes Proventil and nothing else.  Patient has been having increased panic type symptoms.  He states they did do an x-ray down there did not show any pneumonia.  My concern would be possibility of pulmonary embolism because of the 5 hr car trip.  In addition he does have severe COPD as noted by his pulmonary functions from 2017.  ROS  General: (-) fever, (-) chills, (-) night sweats, (-) weakness Skin: (-) rashes, (-) itching,. Eyes: (-) visual changes, (-) redness, (-) itching. Nose and Sinuses: (-) nasal stuffiness or itchiness, (-) postnasal drip, (-) nosebleeds, (-) sinus trouble. Mouth and Throat: (-) sore throat, (-) hoarseness. Neck: (-) swollen glands, (-) enlarged thyroid, (-) neck pain. Respiratory: + cough, (-) bloody sputum, + shortness of breath, + wheezing. Cardiovascular: - ankle swelling, (-) chest pain. Lymphatic: (-) lymph node enlargement. Neurologic: (-) numbness, (-) tingling. Psychiatric: (-) anxiety, (-) depression   Current Medication: Outpatient Encounter Medications as of 11/13/2017  Medication Sig  . albuterol (PROVENTIL HFA;VENTOLIN HFA) 108 (90 Base) MCG/ACT inhaler Inhale 2 puffs into the lungs every 4 (four) hours as needed for wheezing or shortness of breath.  Marland Kitchen. amoxicillin-clavulanate (AUGMENTIN) 875-125 MG tablet Take 1 tablet by mouth 2 (two) times daily. (Patient not taking: Reported on 09/01/2017)  . aspirin 81 MG tablet Take 81 mg by  mouth daily.  . cloNIDine (CATAPRES) 0.1 MG tablet Take 1 tablet (0.1 mg total) by mouth 2 (two) times daily.  Marland Kitchen. diltiazem (CARDIZEM CD) 120 MG 24 hr capsule Take 1 capsule (120 mg total) by mouth daily. (Patient not taking: Reported on 09/01/2017)  . fish oil-omega-3 fatty acids 1000 MG capsule Take 1 g by mouth daily.  . furosemide (LASIX) 40 MG tablet Take 40 mg by mouth daily.  Marland Kitchen. ibuprofen (ADVIL,MOTRIN) 400 MG tablet Take 1 tablet (400 mg total) by mouth every 6 (six) hours as needed.  Marland Kitchen. levofloxacin (LEVAQUIN) 500 MG tablet Take 1 tablet (500 mg total) by mouth daily. (Patient not taking: Reported on 11/13/2017)  . umeclidinium-vilanterol (ANORO ELLIPTA) 62.5-25 MCG/INH AEPB Inhale 1 puff into the lungs daily. (Patient not taking: Reported on 09/01/2017)   No facility-administered encounter medications on file as of 11/13/2017.     Surgical History: Past Surgical History:  Procedure Laterality Date  . CHOLECYSTECTOMY    . TONSILLECTOMY      Medical History: Past Medical History:  Diagnosis Date  . Essential hypertension   . Hyperglycemia   . PSVT (paroxysmal supraventricular tachycardia) (HCC)    a. 10/2012-->coverted with adenosine in ED (6-->12mg );  b 10/2012 Neg MV, EF 59%; c. 10/2012 Echo: EF 55%, nl valves.  . Tobacco abuse     Family History: Family History  Problem Relation Age of Onset  . Heart disease Father [id number]       CABG x 4  . Hypertension Sister     Social History: Social History   Socioeconomic History  .  Marital status: Married    Spouse name: Not on file  . Number of children: Not on file  . Years of education: Not on file  . Highest education level: Not on file  Occupational History  . Not on file  Social Needs  . Financial resource strain: Not on file  . Food insecurity:    Worry: Not on file    Inability: Not on file  . Transportation needs:    Medical: Not on file    Non-medical: Not on file  Tobacco Use  . Smoking status: Light Tobacco Smoker     Types: Cigarettes  . Smokeless tobacco: Former Neurosurgeon    Quit date: 05/12/2015  Substance and Sexual Activity  . Alcohol use: Yes    Comment: ocassionally  . Drug use: No  . Sexual activity: Not on file  Lifestyle  . Physical activity:    Days per week: Not on file    Minutes per session: Not on file  . Stress: Not on file  Relationships  . Social connections:    Talks on phone: Not on file    Gets together: Not on file    Attends religious service: Not on file    Active member of club or organization: Not on file    Attends meetings of clubs or organizations: Not on file    Relationship status: Not on file  . Intimate partner violence:    Fear of current or ex partner: Not on file    Emotionally abused: Not on file    Physically abused: Not on file    Forced sexual activity: Not on file  Other Topics Concern  . Not on file  Social History Narrative  . Not on file    Vital Signs: Blood pressure 140/78, pulse 99, resp. rate (!) 22, height 6\' 2"  (1.88 m), weight 213 lb (96.6 kg), SpO2 (!) 89 %.  Examination: General Appearance: The patient is well-developed, well-nourished, and in no distress. Skin: Gross inspection of skin unremarkable. Head: normocephalic, no gross deformities. Eyes: no gross deformities noted. ENT: ears appear grossly normal no exudates. Neck: Supple. No thyromegaly. No LAD. Respiratory: no rhonchi noted. Cardiovascular: Normal S1 and S2 without murmur or rub. Extremities: No cyanosis. pulses are equal. Neurologic: Alert and oriented. No involuntary movements.  LABS: Recent Results (from the past 2160 hour(s))  Urinalysis, Routine w reflex microscopic     Status: None   Collection Time: 10/03/17 10:43 AM  Result Value Ref Range   Specific Gravity, UA 1.017 1.005 - 1.030   pH, UA 5.0 5.0 - 7.5   Color, UA Yellow Yellow   Appearance Ur Clear Clear   Leukocytes, UA Negative Negative   Protein, UA Negative Negative/Trace   Glucose, UA Negative  Negative   Ketones, UA Negative Negative   RBC, UA Negative Negative   Bilirubin, UA Negative Negative   Urobilinogen, Ur 0.2 0.2 - 1.0 mg/dL   Nitrite, UA Negative Negative   Microscopic Examination Comment     Comment: Microscopic not indicated and not performed.    Radiology: Dg Chest 2 View  Result Date: 09/15/2017 CLINICAL DATA:  Follow-up left-sided pneumonia 2 weeks ago. Currently asymptomatic. History of left thoracotomy for empyema. EXAM: CHEST  2 VIEW COMPARISON:  Chest x-ray of July 22, 2016 and December 05, 2015. FINDINGS: There are chronic changes at the of the left lung base likely secondary to pleuroparenchymal scarring. There are stable coarse lung markings in the left perihilar region. The  right lung is clear. The heart and pulmonary vascularity are normal. The mediastinum is normal in width. The bony thorax exhibits no acute abnormality. The patient has undergone plate and screw fixation of the lateral aspect of the left eighth rib. IMPRESSION: Chronic pleuroparenchymal changes in the left perihilar region and left lung base. No evidence of pneumonia. Electronically Signed   By: David  [place] M.D.   On: 09/15/2017 17:18    No results found.  No results found.    Assessment and Plan: Patient Active Problem List   Diagnosis Date Noted  . COPD (chronic obstructive pulmonary disease) (HCC) 09/01/2017  . Loculated pleural effusion 09/19/2015  . Pleurodynia 09/19/2015  . Pleural effusion 08/01/2015  . Acute respiratory failure with hypoxia (HCC) 07/26/2015  . Pneumonia, community acquired 07/26/2015  . HTN (hypertension), benign 07/26/2015  . PSVT (paroxysmal supraventricular tachycardia) (HCC) 11/04/2012  . Essential hypertension 11/04/2012  . Tobacco abuse 11/04/2012  . Edema 11/04/2012    1. SOB increased symptoms will need to add another inhaler regimen he does not have any maintenance type inhalers.  We will put him on Stioloto Respimet instructed to use the  inhaler as prescribed. 2. COPD severe  He has severe disease based on labs pulmonary functions which were 2 years ago.  We will schedule for follow-up PFTs once he is clinically stabilized 3. Recent travel with increased symptoms my concern here is for prolonged immobilization in a car ride and so therefore we will schedule him for a CT scan of the chest to make certain that he has not had any pulmonary embolic events especially in the setting of his recent lung surgery. 4. S/p decoritication  Follow-up CT as already noted as the chest x-ray is not likely to provide sufficient information  General Counseling: I have discussed the findings of the evaluation and examination with Jomarie Longs.  I have also discussed any further diagnostic evaluation thatmay be needed or ordered today. Aadhav verbalizes understanding of the findings of todays visit. We also reviewed his medications today and discussed drug interactions and side effects including but not limited excessive drowsiness and altered mental states. We also discussed that there is always a risk not just to him but also people around him. he has been encouraged to call the office with any questions or concerns that should arise related to todays visit.    Time spent:  I have personally obtained a history, examined the patient, evaluated laboratory and imaging results, formulated the assessment and plan and placed orders.    Yevonne Pax, MD [facility] Pulmonary and Critical Care Sleep medicine

## 2017-11-13 NOTE — Patient Instructions (Signed)

## 2017-11-18 ENCOUNTER — Other Ambulatory Visit: Payer: Self-pay | Admitting: Internal Medicine

## 2017-11-18 ENCOUNTER — Ambulatory Visit: Payer: Medicare Other

## 2017-11-19 ENCOUNTER — Ambulatory Visit
Admission: RE | Admit: 2017-11-19 | Discharge: 2017-11-19 | Disposition: A | Discharge status: Home / Self Care | Payer: Medicare Other | Source: Ambulatory Visit | Attending: Internal Medicine | Admitting: Internal Medicine

## 2017-11-19 ENCOUNTER — Other Ambulatory Visit: Payer: Self-pay

## 2017-11-19 ENCOUNTER — Telehealth: Payer: Self-pay

## 2017-11-19 ENCOUNTER — Ambulatory Visit: Payer: Self-pay | Admitting: Internal Medicine

## 2017-11-19 ENCOUNTER — Encounter: Payer: Self-pay | Admitting: Emergency Medicine

## 2017-11-19 ENCOUNTER — Inpatient Hospital Stay
Admission: EM | Admit: 2017-11-19 | Discharge: 2017-11-22 | DRG: 193 | Disposition: A | Discharge status: Home / Self Care | Payer: Medicare Other | Attending: Internal Medicine | Admitting: Internal Medicine

## 2017-11-19 DIAGNOSIS — I10 Essential (primary) hypertension: Secondary | ICD-10-CM | POA: Diagnosis present

## 2017-11-19 DIAGNOSIS — J9621 Acute and chronic respiratory failure with hypoxia: Secondary | ICD-10-CM | POA: Diagnosis present

## 2017-11-19 DIAGNOSIS — Z79899 Other long term (current) drug therapy: Secondary | ICD-10-CM

## 2017-11-19 DIAGNOSIS — R0602 Shortness of breath: Secondary | ICD-10-CM

## 2017-11-19 DIAGNOSIS — Z888 Allergy status to other drugs, medicaments and biological substances status: Secondary | ICD-10-CM | POA: Diagnosis not present

## 2017-11-19 DIAGNOSIS — J189 Pneumonia, unspecified organism: Secondary | ICD-10-CM | POA: Diagnosis not present

## 2017-11-19 DIAGNOSIS — J441 Chronic obstructive pulmonary disease with (acute) exacerbation: Secondary | ICD-10-CM | POA: Diagnosis present

## 2017-11-19 DIAGNOSIS — Y95 Nosocomial condition: Secondary | ICD-10-CM | POA: Diagnosis present

## 2017-11-19 DIAGNOSIS — Z720 Tobacco use: Secondary | ICD-10-CM | POA: Diagnosis present

## 2017-11-19 DIAGNOSIS — R0609 Other forms of dyspnea: Secondary | ICD-10-CM | POA: Diagnosis present

## 2017-11-19 DIAGNOSIS — Z8709 Personal history of other diseases of the respiratory system: Secondary | ICD-10-CM

## 2017-11-19 DIAGNOSIS — Z885 Allergy status to narcotic agent status: Secondary | ICD-10-CM

## 2017-11-19 DIAGNOSIS — J869 Pyothorax without fistula: Secondary | ICD-10-CM

## 2017-11-19 DIAGNOSIS — J181 Lobar pneumonia, unspecified organism: Principal | ICD-10-CM | POA: Diagnosis present

## 2017-11-19 DIAGNOSIS — Z7982 Long term (current) use of aspirin: Secondary | ICD-10-CM | POA: Diagnosis not present

## 2017-11-19 DIAGNOSIS — F17210 Nicotine dependence, cigarettes, uncomplicated: Secondary | ICD-10-CM | POA: Diagnosis present

## 2017-11-19 DIAGNOSIS — Z8679 Personal history of other diseases of the circulatory system: Secondary | ICD-10-CM

## 2017-11-19 DIAGNOSIS — J449 Chronic obstructive pulmonary disease, unspecified: Secondary | ICD-10-CM

## 2017-11-19 DIAGNOSIS — J440 Chronic obstructive pulmonary disease with acute lower respiratory infection: Secondary | ICD-10-CM | POA: Diagnosis present

## 2017-11-19 DIAGNOSIS — J4489 Other specified chronic obstructive pulmonary disease: Secondary | ICD-10-CM

## 2017-11-19 DIAGNOSIS — R079 Chest pain, unspecified: Secondary | ICD-10-CM | POA: Diagnosis not present

## 2017-11-19 DIAGNOSIS — J9601 Acute respiratory failure with hypoxia: Secondary | ICD-10-CM | POA: Diagnosis not present

## 2017-11-19 DIAGNOSIS — R0600 Dyspnea, unspecified: Secondary | ICD-10-CM

## 2017-11-19 DIAGNOSIS — R062 Wheezing: Secondary | ICD-10-CM

## 2017-11-19 HISTORY — DX: Pyothorax without fistula: J86.9

## 2017-11-19 HISTORY — DX: Chronic obstructive pulmonary disease, unspecified: J44.9

## 2017-11-19 LAB — CBC WITH DIFFERENTIAL/PLATELET
Basophils Absolute: 0 10*3/uL (ref 0–0.1)
Basophils Relative: 1 %
EOS ABS: 0.1 10*3/uL (ref 0–0.7)
EOS PCT: 1 %
HCT: 42.8 % (ref 40.0–52.0)
Hemoglobin: 14.6 g/dL (ref 13.0–18.0)
LYMPHS ABS: 1 10*3/uL (ref 1.0–3.6)
Lymphocytes Relative: 16 %
MCH: 29.7 pg (ref 26.0–34.0)
MCHC: 34.2 g/dL (ref 32.0–36.0)
MCV: 86.8 fL (ref 80.0–100.0)
MONO ABS: 0.4 10*3/uL (ref 0.2–1.0)
MONOS PCT: 6 %
Neutro Abs: 4.8 10*3/uL (ref 1.4–6.5)
Neutrophils Relative %: 76 %
PLATELETS: 417 10*3/uL (ref 150–440)
RBC: 4.93 MIL/uL (ref 4.40–5.90)
RDW: 13.9 % (ref 11.5–14.5)
WBC: 6.4 10*3/uL (ref 3.8–10.6)

## 2017-11-19 LAB — BLOOD GAS, ARTERIAL
ACID-BASE EXCESS: 1 mmol/L (ref 0.0–2.0)
Bicarbonate: 25.2 mmol/L (ref 20.0–28.0)
FIO2: 0.28
O2 Saturation: 91.7 %
PATIENT TEMPERATURE: 37
pCO2 arterial: 38 mmHg (ref 32.0–48.0)
pH, Arterial: 7.43 (ref 7.350–7.450)
pO2, Arterial: 61 mmHg — ABNORMAL LOW (ref 83.0–108.0)

## 2017-11-19 LAB — URINALYSIS, COMPLETE (UACMP) WITH MICROSCOPIC
BACTERIA UA: NONE SEEN
Bilirubin Urine: NEGATIVE
Glucose, UA: NEGATIVE mg/dL
Hgb urine dipstick: NEGATIVE
Ketones, ur: NEGATIVE mg/dL
Leukocytes, UA: NEGATIVE
Nitrite: NEGATIVE
PH: 7 (ref 5.0–8.0)
Protein, ur: NEGATIVE mg/dL
SPECIFIC GRAVITY, URINE: 1.036 — AB (ref 1.005–1.030)
SQUAMOUS EPITHELIAL / LPF: NONE SEEN

## 2017-11-19 LAB — PROCALCITONIN: Procalcitonin: <0.1 ng/mL

## 2017-11-19 LAB — COMPREHENSIVE METABOLIC PANEL
ALT: 35 U/L (ref 17–63)
AST: 28 U/L (ref 15–41)
Albumin: 3.4 g/dL — ABNORMAL LOW (ref 3.5–5.0)
Alkaline Phosphatase: 84 U/L (ref 38–126)
Anion gap: 6 (ref 5–15)
BUN: 15 mg/dL (ref 6–20)
CO2: 26 mmol/L (ref 22–32)
CREATININE: 0.9 mg/dL (ref 0.61–1.24)
Calcium: 8.4 mg/dL — ABNORMAL LOW (ref 8.9–10.3)
Chloride: 104 mmol/L (ref 101–111)
GFR calc non Af Amer: >60 mL/min (ref >60)
Glucose, Bld: 185 mg/dL — ABNORMAL HIGH (ref 65–99)
Potassium: 4.1 mmol/L (ref 3.5–5.1)
SODIUM: 136 mmol/L (ref 135–145)
Total Bilirubin: 0.7 mg/dL (ref 0.3–1.2)
Total Protein: 6.7 g/dL (ref 6.5–8.1)

## 2017-11-19 LAB — PROTIME-INR
INR: 0.98
PROTHROMBIN TIME: 12.9 s (ref 11.4–15.2)

## 2017-11-19 LAB — LACTIC ACID, PLASMA: Lactic Acid, Venous: 1.8 mmol/L (ref 0.5–1.9)

## 2017-11-19 LAB — POCT I-STAT CREATININE: Creatinine, Ser: 0.8 mg/dL (ref 0.61–1.24)

## 2017-11-19 MED ORDER — CEFEPIME HCL 2 G IJ SOLR
2.0000 g | Freq: Once | INTRAMUSCULAR | Status: AC
  Administered 2017-11-19: 2 g via INTRAVENOUS
  Filled 2017-11-19: qty 2

## 2017-11-19 MED ORDER — IOHEXOL 350 MG/ML SOLN
75.0000 mL | Freq: Once | INTRAVENOUS | Status: AC | PRN
  Administered 2017-11-19: 75 mL via INTRAVENOUS

## 2017-11-19 MED ORDER — IPRATROPIUM-ALBUTEROL 0.5-2.5 (3) MG/3ML IN SOLN
3.0000 mL | Freq: Four times a day (QID) | RESPIRATORY_TRACT | Status: DC
  Administered 2017-11-20 – 2017-11-21 (×6): 3 mL via RESPIRATORY_TRACT
  Filled 2017-11-19 (×6): qty 3

## 2017-11-19 MED ORDER — CLONIDINE HCL 0.1 MG PO TABS
0.1000 mg | ORAL_TABLET | Freq: Two times a day (BID) | ORAL | Status: DC
  Administered 2017-11-20 – 2017-11-22 (×5): 0.1 mg via ORAL
  Filled 2017-11-19 (×5): qty 1

## 2017-11-19 MED ORDER — OMEGA-3 FATTY ACIDS 1000 MG PO CAPS: 1.0000 g | ORAL_CAPSULE | Freq: Every day | ORAL | Status: DC

## 2017-11-19 MED ORDER — ENOXAPARIN SODIUM 40 MG/0.4ML ~~LOC~~ SOLN
40.0000 mg | SUBCUTANEOUS | Status: DC
  Administered 2017-11-21 – 2017-11-22 (×2): 40 mg via SUBCUTANEOUS
  Filled 2017-11-19 (×2): qty 0.4

## 2017-11-19 MED ORDER — SODIUM CHLORIDE 0.9 % IV SOLN
2.0000 g | Freq: Two times a day (BID) | INTRAVENOUS | Status: DC
  Administered 2017-11-20: 2 g via INTRAVENOUS
  Filled 2017-11-19 (×2): qty 2

## 2017-11-19 MED ORDER — ASPIRIN EC 81 MG PO TBEC
81.0000 mg | DELAYED_RELEASE_TABLET | Freq: Every day | ORAL | Status: DC
  Administered 2017-11-21 – 2017-11-22 (×2): 81 mg via ORAL
  Filled 2017-11-19 (×3): qty 1

## 2017-11-19 MED ORDER — IBUPROFEN 400 MG PO TABS: 400.0000 mg | ORAL_TABLET | Freq: Four times a day (QID) | ORAL | Status: DC | PRN

## 2017-11-19 MED ORDER — ALBUTEROL SULFATE (2.5 MG/3ML) 0.083% IN NEBU: 2.5000 mg | INHALATION_SOLUTION | RESPIRATORY_TRACT | Status: DC | PRN

## 2017-11-19 MED ORDER — SODIUM CHLORIDE 0.9 % IV SOLN: 1.0000 g | Freq: Three times a day (TID) | INTRAVENOUS | Status: DC

## 2017-11-19 MED ORDER — BUDESONIDE 0.5 MG/2ML IN SUSP
0.5000 mg | Freq: Two times a day (BID) | RESPIRATORY_TRACT | Status: DC
  Administered 2017-11-20 – 2017-11-22 (×5): 0.5 mg via RESPIRATORY_TRACT
  Filled 2017-11-19 (×5): qty 2

## 2017-11-19 MED ORDER — VANCOMYCIN HCL IN DEXTROSE 1-5 GM/200ML-% IV SOLN
1000.0000 mg | Freq: Once | INTRAVENOUS | Status: AC
  Administered 2017-11-19: 1000 mg via INTRAVENOUS
  Filled 2017-11-19: qty 200

## 2017-11-19 MED ORDER — SODIUM CHLORIDE 0.9 % IV SOLN
1500.0000 mg | Freq: Two times a day (BID) | INTRAVENOUS | Status: DC
  Filled 2017-11-19 (×3): qty 1500

## 2017-11-19 MED ORDER — FUROSEMIDE 40 MG PO TABS
40.0000 mg | ORAL_TABLET | Freq: Every day | ORAL | Status: DC
  Filled 2017-11-19: qty 1

## 2017-11-19 NOTE — H&P (Signed)
[facility] -  at [facility]   PATIENT NAME: Aaron Luna    MR#:  [id number]  DATE OF BIRTH:  09/10/1950  DATE OF ADMISSION:  11/19/2017  PRIMARY CARE PHYSICIAN: Carlean JewsBoscia, Heather E, NP   REQUESTING/REFERRING PHYSICIAN:   CHIEF COMPLAINT:   Chief Complaint  Patient presents with  . Pneumonia    HISTORY OF PRESENT ILLNESS: Aaron Luna  is a 67 y.o. male with a known history recent pneumonia 2 months ago where patient was hospitalized, completed 3-week course of Levaquin for pneumonia about a week ago, complicated long history with left lung collapse requiring decortication, followed by Dr. Farris HasKohn/pulmonology as an outpatient, was seen by Dr. Lennette BihariKohn and noted to have shortness of breath/dyspnea on exertion, CT of the chest noted for multifocal pneumonia, patient was sent to the emergency room for further evaluation/admission, ER workup noted tachypnea, UA negative, chemistries unimpressive, noted O2 saturation 89% on room air, patient is now been admitted for acute HCAP.  PAST MEDICAL HISTORY:   Past Medical History:  Diagnosis Date  . Essential hypertension   . Hyperglycemia   . PSVT (paroxysmal supraventricular tachycardia) (HCC)    a. 10/2012-->coverted with adenosine in ED (6-->12mg );  b 10/2012 Neg MV, EF 59%; c. 10/2012 Echo: EF 55%, nl valves.  . Tobacco abuse     PAST SURGICAL HISTORY:  Past Surgical History:  Procedure Laterality Date  . CHOLECYSTECTOMY    . TONSILLECTOMY      SOCIAL HISTORY:  Social History   Tobacco Use  . Smoking status: Light Tobacco Smoker    Types: Cigarettes  . Smokeless tobacco: Former NeurosurgeonUser    Quit date: 05/12/2015  Substance Use Topics  . Alcohol use: Yes    Comment: ocassionally    FAMILY HISTORY:  Family History  Problem Relation Age of Onset  . Heart disease Father 9456       CABG x 4  . Hypertension Sister     DRUG ALLERGIES:  Allergies  Allergen Reactions  . Prednisone     REVIEW OF SYSTEMS:    CONSTITUTIONAL: No fever, +fatigue, weakness.  EYES: No blurred or double vision.  EARS, NOSE, AND THROAT: No tinnitus or ear pain.  RESPIRATORY: + cough, shortness of breath, wheezing, no hemoptysis.  CARDIOVASCULAR: No chest pain, orthopnea, edema.  GASTROINTESTINAL: No nausea, vomiting, diarrhea or abdominal pain.  GENITOURINARY: No dysuria, hematuria.  ENDOCRINE: No polyuria, nocturia,  HEMATOLOGY: No anemia, easy bruising or bleeding SKIN: No rash or lesion. MUSCULOSKELETAL: No joint pain or arthritis.   NEUROLOGIC: No tingling, numbness, weakness.  PSYCHIATRY: No anxiety or depression.   MEDICATIONS AT HOME:  Prior to Admission medications   Medication Sig Start Date End Date Taking? Authorizing Provider  albuterol (PROVENTIL HFA;VENTOLIN HFA) 108 (90 Base) MCG/ACT inhaler Inhale 2 puffs into the lungs every 4 (four) hours as needed for wheezing or shortness of breath. 09/01/17   Carlean JewsBoscia, Heather E, NP  aspirin 81 MG tablet Take 81 mg by mouth daily.    [provider]  cloNIDine (CATAPRES) 0.1 MG tablet Take 1 tablet (0.1 mg total) by mouth 2 (two) times daily. 10/03/17   Carlean JewsBoscia, Heather E, NP  fish oil-omega-3 fatty acids 1000 MG capsule Take 1 g by mouth daily.    [provider]  furosemide (LASIX) 40 MG tablet Take 40 mg by mouth daily.    [provider]  ibuprofen (ADVIL,MOTRIN) 400 MG tablet Take 1 tablet (400 mg total) by mouth every 6 (six)  hours as needed. 08/01/15   Gale Journey, MD  Tiotropium Bromide-Olodaterol (STIOLTO RESPIMAT) 2.5-2.5 MCG/ACT AERS Inhale 1 puff into the lungs daily. 11/13/17   Yevonne Pax, MD      PHYSICAL EXAMINATION:   VITAL SIGNS: Blood pressure 128/69, pulse 73, temperature 98.2 F (36.8 C), temperature source Oral, resp. rate (!) 25, height 6\' 2"  (1.88 m), weight 95.3 kg (210 lb), SpO2 94 %.  GENERAL:  67 y.o.-year-old patient lying in the bed with no acute distress.  EYES: Pupils equal, round, reactive  to light and accommodation. No scleral icterus. Extraocular muscles intact.  HEENT: Head atraumatic, normocephalic. Oropharynx and nasopharynx clear.  NECK:  Supple, no jugular venous distention. No thyroid enlargement, no tenderness.  LUNGS: Rales at left base, coarse breath sounds bilaterally. No use of accessory muscles of respiration.  CARDIOVASCULAR: S1, S2 normal. No murmurs, rubs, or gallops.  ABDOMEN: Soft, nontender, nondistended. Bowel sounds present. No organomegaly or mass.  EXTREMITIES: No pedal edema, cyanosis, or clubbing.  NEUROLOGIC: Cranial nerves II through XII are intact. Muscle strength 5/5 in all extremities. Sensation intact. Gait not checked.  PSYCHIATRIC: The patient is alert and oriented x 3.  SKIN: No obvious rash, lesion, or ulcer.   LABORATORY PANEL:   CBC Recent Labs  Lab 11/19/17 1519  WBC 6.4  HGB 14.6  HCT 42.8  PLT 417  MCV 86.8  MCH 29.7  MCHC 34.2  RDW 13.9  LYMPHSABS 1.0  MONOABS 0.4  EOSABS 0.1  BASOSABS 0.0   ------------------------------------------------------------------------------------------------------------------  Chemistries  Recent Labs  Lab 11/19/17 1229 11/19/17 1519  NA  --  136  K  --  4.1  CL  --  104  CO2  --  26  GLUCOSE  --  185*  BUN  --  15  CREATININE 0.80 0.90  CALCIUM  --  8.4*  AST  --  28  ALT  --  35  ALKPHOS  --  84  BILITOT  --  0.7   ------------------------------------------------------------------------------------------------------------------ estimated creatinine clearance is 92.6 mL/min (by C-G formula based on SCr of 0.9 mg/dL). ------------------------------------------------------------------------------------------------------------------ No results for input(s): TSH, T4TOTAL, T3FREE, THYROIDAB in the last 72 hours.  Invalid input(s): FREET3   Coagulation profile Recent Labs  Lab 11/19/17 1519  INR 0.98    ------------------------------------------------------------------------------------------------------------------- No results for input(s): DDIMER in the last 72 hours. -------------------------------------------------------------------------------------------------------------------  Cardiac Enzymes No results for input(s): CKMB, TROPONINI, MYOGLOBIN in the last 168 hours.  Invalid input(s): CK ------------------------------------------------------------------------------------------------------------------ Invalid input(s): POCBNP  ---------------------------------------------------------------------------------------------------------------  Urinalysis    Component Value Date/Time   COLORURINE YELLOW (A) 11/19/2017 1519   APPEARANCEUR HAZY (A) 11/19/2017 1519   APPEARANCEUR Clear 10/03/2017 1043   LABSPEC 1.036 (H) 11/19/2017 1519   LABSPEC 1.005 10/18/2012 1908   PHURINE 7.0 11/19/2017 1519   GLUCOSEU NEGATIVE 11/19/2017 1519   GLUCOSEU Negative 10/18/2012 1908   HGBUR NEGATIVE 11/19/2017 1519   BILIRUBINUR NEGATIVE 11/19/2017 1519   BILIRUBINUR Negative 10/03/2017 1043   BILIRUBINUR Negative 10/18/2012 1908   KETONESUR NEGATIVE 11/19/2017 1519   PROTEINUR NEGATIVE 11/19/2017 1519   NITRITE NEGATIVE 11/19/2017 1519   LEUKOCYTESUR NEGATIVE 11/19/2017 1519   LEUKOCYTESUR Negative 10/03/2017 1043   LEUKOCYTESUR Negative 10/18/2012 1908     RADIOLOGY: Ct Angio Chest Pe W Or Wo Contrast  Result Date: 11/19/2017 CLINICAL DATA:  Shortness of breath 2 years. Symptoms have worsened over the past 3 weeks. EXAM: CT ANGIOGRAPHY CHEST WITH CONTRAST TECHNIQUE: Multidetector CT imaging of the chest was performed  using the standard protocol during bolus administration of intravenous contrast. Multiplanar CT image reconstructions and MIPs were obtained to evaluate the vascular anatomy. CONTRAST:  75 ml OMNIPAQUE IOHEXOL 350 MG/ML SOLN COMPARISON:  PA and lateral chest 09/15/2017 and  10/16/2015. CT chest 11/28/2015. FINDINGS: Cardiovascular: Satisfactory opacification of the pulmonary arteries to the segmental level. No evidence of pulmonary embolism. Normal heart size. No pericardial effusion. Scattered aortic atherosclerotic calcifications are identified. Mediastinum/Nodes: No enlarged mediastinal, hilar, or axillary lymph nodes. Thyroid gland, trachea, and esophagus demonstrate no significant findings. Lungs/Pleura: No pleural effusion. The lungs demonstrate emphysematous disease. There is new airspace opacity in the periphery of the right lung apex. A small focus of airspace opacity in the right upper lobe is contiguous with the major fissure. There is also new airspace opacity in the left lung base superimposed on chronic scar. Upper Abdomen: Negative. No acute bony abnormality. Plate and screw fixation of the left seventh rib noted. Musculoskeletal: Negative for pulmonary embolus. Review of the MIP images confirms the above findings. IMPRESSION: Negative for pulmonary embolus. Airspace disease in the right upper lobe and left lower lobe most consistent with multifocal pneumonia. No change in left basilar scar. Aortic Atherosclerosis (ICD10-I70.0) and Emphysema (ICD10-J43.9). Electronically Signed   By: Drusilla Kanner M.D.   On: 11/19/2017 12:58    EKG: Orders placed or performed during the hospital encounter of 11/19/17  . ED EKG  . ED EKG    IMPRESSION AND PLAN: Acute recurrent HCAP Acute hypoxic respiratory failure COPD without exacerbation Chronic benign essential hypertension   Admit to regular nursing for bed on our pneumonia protocol, empiric cefepime/vancomycin, check sputum cultures, respiratory therapy to see, supplemental oxygen with weaning as tolerated, IV fluids for rehydration, follow-up on cultures, check ABG, consult pulmonology given complicated long history, and continue close medical monitoring    All the records are reviewed and case discussed with  ED provider. Management plans discussed with the patient, family and they are in agreement.  CODE STATUS: Code Status History    Date Active Date Inactive Code Status Order ID Comments User Context   07/26/2015 1008 08/01/2015 2046 Full Code [id number]  Crissie Figures, MD Inpatient       TOTAL TIME TAKING CARE OF THIS PATIENT: 45 minutes.    Evelena Asa Salary M.D on 11/19/2017   Between 7am to 6pm - Pager - [phone]  After 6pm go to [email or web address] - password [email or web address]  [facility]  Office  [phone]  CC: Primary care physician; Carlean Jews, NP   Note: This dictation was prepared with Dragon dictation along with smaller phrase technology. Any transcriptional errors that result from this process are unintentional.

## 2017-11-19 NOTE — Progress Notes (Signed)
Pharmacy Antibiotic Note  Aaron AriasJoseph W Luna is a 67 y.o. male admitted on 11/19/2017 with pneumonia.  Pharmacy has been consulted for cefepime and vancomycin dosing.  Plan: 1. Cefepime 2 gm IV Q12H 2. Vancomycin 1 gm IV x 1 in ED followed in approximately 6 hours (stacked dosing) by vancomycin 1.5 gm IV Q12H, predicted trough 16 mcg/mL. Pharmacy will continue to follow and adjust as needed to maintain trough 15 to 20 mcg/mL.   Vd 57.6 L, Ke 0.081 hr-1, T1/2 8.5 hr  Height: 6\' 2"  (188 cm) Weight: 210 lb (95.3 kg) IBW/kg (Calculated) : 82.2  Temp (24hrs), Avg:98.2 F (36.8 C), Min:98.2 F (36.8 C), Max:98.2 F (36.8 C)  Recent Labs  Lab 11/19/17 1229 11/19/17 1519  WBC  --  6.4  CREATININE 0.80 0.90  LATICACIDVEN  --  1.8    Estimated Creatinine Clearance: 92.6 mL/min (by C-G formula based on SCr of 0.9 mg/dL).    Allergies  Allergen Reactions  . Prednisone     Antimicrobials this admission: Vanc and cefepime x 1 in ED  Dose adjustments this admission:   Microbiology results: 4/10 BCx: pending   Thank you for allowing pharmacy to be a part of this patient's care.  Carola FrostNathan A Kaspar Albornoz, Pharm.D., BCPS Clinical Pharmacist 11/19/2017 6:59 PM

## 2017-11-19 NOTE — ED Provider Notes (Signed)
[facility] Emergency Department Provider Note  ____________________________________________  Time seen: Approximately 7:00 PM  I have reviewed the triage vital signs and the nursing notes.   HISTORY  Chief Complaint Pneumonia    HPI Aaron Luna is a 67 y.o. male sent to the ED by his physician due to pneumonia. The patient was hospitalized with pneumonia about 2 months ago. Afterward, he completed a 21 day course of Levaquin outpatient, ending just a few weeks ago. Within the past week, the patient returned from a trip to [place] and saw his doctor after his breathing was worse. He was sent for a CT angiogram of the chest to evaluate for PE which was negative for PE but did show persistent right upper lobe and left lower lobe infiltrate concerning for pneumonia. I have reviewed the electronic medical record and the CT scan report. Patient does report that he has severely limited exertion currently with dyspnea on exertion. On his home pulse oximeter he finds his oxygen level to be in the 60s and 70s when he ambulates and in the morning. He gets severely short of breath with ambulation of any distance particularly if he tries to walk from one room to the other in his house. No alleviating factors. Severe. Constant.      Past Medical History:  Diagnosis Date  . Essential hypertension   . Hyperglycemia   . PSVT (paroxysmal supraventricular tachycardia) (HCC)    a. 10/2012-->coverted with adenosine in ED (6-->12mg );  b 10/2012 Neg MV, EF 59%; c. 10/2012 Echo: EF 55%, nl valves.  . Tobacco abuse      Patient Active Problem List   Diagnosis Date Noted  . COPD (chronic obstructive pulmonary disease) (HCC) 09/01/2017  . Loculated pleural effusion 09/19/2015  . Pleurodynia 09/19/2015  . Pleural effusion 08/01/2015  . Acute respiratory failure with hypoxia (HCC) 07/26/2015  . Pneumonia, community acquired 07/26/2015  . HTN (hypertension), benign  07/26/2015  . PSVT (paroxysmal supraventricular tachycardia) (HCC) 11/04/2012  . Essential hypertension 11/04/2012  . Tobacco abuse 11/04/2012  . Edema 11/04/2012     Past Surgical History:  Procedure Laterality Date  . CHOLECYSTECTOMY    . TONSILLECTOMY       Prior to Admission medications   Medication Sig Start Date End Date Taking? Authorizing Provider  albuterol (PROVENTIL HFA;VENTOLIN HFA) 108 (90 Base) MCG/ACT inhaler Inhale 2 puffs into the lungs every 4 (four) hours as needed for wheezing or shortness of breath. 09/01/17   Carlean Jews, NP  amoxicillin-clavulanate (AUGMENTIN) 875-125 MG tablet Take 1 tablet by mouth 2 (two) times daily. Patient not taking: Reported on 09/01/2017 08/08/17   Carlean Jews, NP  aspirin 81 MG tablet Take 81 mg by mouth daily.    [provider]  cloNIDine (CATAPRES) 0.1 MG tablet Take 1 tablet (0.1 mg total) by mouth 2 (two) times daily. 10/03/17   Carlean Jews, NP  diltiazem (CARDIZEM CD) 120 MG 24 hr capsule Take 1 capsule (120 mg total) by mouth daily. Patient not taking: Reported on 09/01/2017 02/27/16   Antonieta Iba, MD  fish oil-omega-3 fatty acids 1000 MG capsule Take 1 g by mouth daily.    [provider]  furosemide (LASIX) 40 MG tablet Take 40 mg by mouth daily.    [provider]  ibuprofen (ADVIL,MOTRIN) 400 MG tablet Take 1 tablet (400 mg total) by mouth every 6 (six) hours as needed. 08/01/15   Gale Journey, MD  levofloxacin (  LEVAQUIN) 500 MG tablet Take 1 tablet (500 mg total) by mouth daily. Patient not taking: Reported on 11/13/2017 09/01/17   Carlean Jews, NP  Tiotropium Bromide-Olodaterol (STIOLTO RESPIMAT) 2.5-2.5 MCG/ACT AERS Inhale 1 puff into the lungs daily. 11/13/17   Yevonne Pax, MD  umeclidinium-vilanterol (ANORO ELLIPTA) 62.5-25 MCG/INH AEPB Inhale 1 puff into the lungs daily. Patient not taking: Reported on 09/01/2017 10/16/15   Merwyn Katos, MD      Allergies Prednisone   Family History  Problem Relation Age of Onset  . Heart disease Father 63       CABG x 4  . Hypertension Sister     Social History Social History   Tobacco Use  . Smoking status: Light Tobacco Smoker    Types: Cigarettes  . Smokeless tobacco: Former Neurosurgeon    Quit date: 05/12/2015  Substance Use Topics  . Alcohol use: Yes    Comment: ocassionally  . Drug use: No    Review of Systems  Constitutional:   No fever or chills.  ENT:   No sore throat. No rhinorrhea. Cardiovascular:   No chest pain or syncope. Respiratory:   positive as above shortness of breath and cough. Gastrointestinal:   Negative for abdominal pain, vomiting and diarrhea.  Musculoskeletal:   Negative for focal pain or swelling All other systems reviewed and are negative except as documented above in ROS and HPI.  ____________________________________________   PHYSICAL EXAM:  VITAL SIGNS: ED Triage Vitals  Enc Vitals Group     BP 11/19/17 1459 (!) 132/101     Pulse Rate 11/19/17 1459 88     Resp 11/19/17 1459 (!) 22     Temp 11/19/17 1459 98.2 F (36.8 C)     Temp Source 11/19/17 1459 Oral     SpO2 11/19/17 1459 93 %     Weight 11/19/17 1500 210 lb (95.3 kg)     Height 11/19/17 1500 6\' 2"  (1.88 m)     Head Circumference --      Peak Flow --      Pain Score 11/19/17 1459 0     Pain Loc --      Pain Edu? --      Excl. in GC? --     Vital signs reviewed, nursing assessments reviewed.   Constitutional:   Alert and oriented. Well appearing and in no distress. Eyes:   Conjunctivae are normal. EOMI. PERRL. ENT      Head:   Normocephalic and atraumatic.      Nose:   No congestion/rhinnorhea.       Mouth/Throat:   MMM, no pharyngeal erythema. No peritonsillar mass.       Neck:   No meningismus. Full ROM. Hematological/Lymphatic/Immunilogical:   No cervical lymphadenopathy. Cardiovascular:   RRR. Symmetric bilateral radial and DP pulses.  No murmurs.  Respiratory:    tachypnea. Normal work of breathing. Symmetric air entry in all lung fields. Dense crackles left lower lung.. Gastrointestinal:   Soft and nontender. Non distended. There is no CVA tenderness.  No rebound, rigidity, or guarding. Genitourinary:   deferred Musculoskeletal:   Normal range of motion in all extremities. No joint effusions.  No lower extremity tenderness.  No edema. Neurologic:   Normal speech and language.  Motor grossly intact. No acute focal neurologic deficits are appreciated.  Skin:    Skin is warm, dry and intact. No rash noted.  No petechiae, purpura, or bullae.  ____________________________________________    LABS (pertinent positives/negatives) (  all labs ordered are listed, but only abnormal results are displayed) Labs Reviewed  COMPREHENSIVE METABOLIC PANEL - Abnormal; Notable for the following components:      Result Value   Glucose, Bld 185 (*)    Calcium 8.4 (*)    Albumin 3.4 (*)    All other components within normal limits  URINALYSIS, COMPLETE (UACMP) WITH MICROSCOPIC - Abnormal; Notable for the following components:   Color, Urine YELLOW (*)    APPearance HAZY (*)    Specific Gravity, Urine 1.036 (*)    All other components within normal limits  CULTURE, BLOOD (ROUTINE X 2)  CULTURE, BLOOD (ROUTINE X 2)  LACTIC ACID, PLASMA  CBC WITH DIFFERENTIAL/PLATELET  PROTIME-INR  PROCALCITONIN   ____________________________________________   EKG  interpreted by me  Date: 11/19/2017  Rate: 68  Rhythm: normal sinus rhythm  QRS Axis: normal  Intervals: normal  ST/T Wave abnormalities: normal  Conduction Disutrbances: none  Narrative Interpretation: unremarkable      ____________________________________________    RADIOLOGY  Ct Angio Chest Pe W Or Wo Contrast  Result Date: 11/19/2017 CLINICAL DATA:  Shortness of breath 2 years. Symptoms have worsened over the past 3 weeks. EXAM: CT ANGIOGRAPHY CHEST WITH CONTRAST TECHNIQUE: Multidetector CT  imaging of the chest was performed using the standard protocol during bolus administration of intravenous contrast. Multiplanar CT image reconstructions and MIPs were obtained to evaluate the vascular anatomy. CONTRAST:  75 ml OMNIPAQUE IOHEXOL 350 MG/ML SOLN COMPARISON:  PA and lateral chest 09/15/2017 and 10/16/2015. CT chest 11/28/2015. FINDINGS: Cardiovascular: Satisfactory opacification of the pulmonary arteries to the segmental level. No evidence of pulmonary embolism. Normal heart size. No pericardial effusion. Scattered aortic atherosclerotic calcifications are identified. Mediastinum/Nodes: No enlarged mediastinal, hilar, or axillary lymph nodes. Thyroid gland, trachea, and esophagus demonstrate no significant findings. Lungs/Pleura: No pleural effusion. The lungs demonstrate emphysematous disease. There is new airspace opacity in the periphery of the right lung apex. A small focus of airspace opacity in the right upper lobe is contiguous with the major fissure. There is also new airspace opacity in the left lung base superimposed on chronic scar. Upper Abdomen: Negative. No acute bony abnormality. Plate and screw fixation of the left seventh rib noted. Musculoskeletal: Negative for pulmonary embolus. Review of the MIP images confirms the above findings. IMPRESSION: Negative for pulmonary embolus. Airspace disease in the right upper lobe and left lower lobe most consistent with multifocal pneumonia. No change in left basilar scar. Aortic Atherosclerosis (ICD10-I70.0) and Emphysema (ICD10-J43.9). Electronically Signed   By: Drusilla Kannerhomas  Dalessio M.D.   On: 11/19/2017 12:58    ____________________________________________   PROCEDURES Procedures  ____________________________________________    CLINICAL IMPRESSION / ASSESSMENT AND PLAN / ED COURSE  Pertinent labs & imaging results that were available during my care of the patient were reviewed by me and considered in my medical decision making (see  chart for details).    patient presents with persistent shortness of breath, exam concerning for pneumonia. CT scan shows infiltrates compatible with pneumonia. This indicates a healthcare associated pneumonia. I reported the patient IV cefepime and vancomycin for further treatment. I discussed the case with the hospitalist for further management. It is particularly important to be aggressive with antibiotics in this patient due to his history of having to have surgical decortication of an empyema resulting from pneumonic effusion. Patient is not septic. He did have oxygen saturation of 89% on room air..      ____________________________________________   FINAL CLINICAL IMPRESSION(S) / ED  DIAGNOSES    Final diagnoses:  HCAP (healthcare-associated pneumonia)  Dyspnea on exertion  Acute respiratory failure with hypoxia [facility])     ED Discharge Orders    None      Portions of this note were generated with dragon dictation software. Dictation errors may occur despite best attempts at proofreading.    Sharman Cheek, MD 11/19/17 Ebony Cargo

## 2017-11-19 NOTE — Telephone Encounter (Signed)
Pt wife advised for ctscan result negative for pulmonary embolus. Airspace disease in the right upper lobe and left lower lobe most consistent with multifocal pneumonia as per dr Welton Flakeskhan if pt sob he can go to ER and I spoke with pt wife his O2 is 89 to 90 so I advised pt wife taake him to ER

## 2017-11-19 NOTE — ED Triage Notes (Signed)
Pt states he was sent by his Dr, states his CT scan a with contrast a few hours ago today showed Pneumonia. Pt states he has been on 3 rounds of oral antibiotics without relief. States he is shob, denies cp.

## 2017-11-19 NOTE — ED Notes (Signed)
Lab called stating MRSA sample on both nostrils was invalid, patient refusing recollect at this time d/t his nares hurt during collect.

## 2017-11-19 NOTE — ED Notes (Signed)
David RN, aware of bed assigned  

## 2017-11-19 NOTE — ED Notes (Signed)
Blood cultures drawn and sent to lab

## 2017-11-19 NOTE — ED Notes (Signed)
Mayra, EDT to take patient to [id number]

## 2017-11-20 ENCOUNTER — Encounter: Payer: Self-pay | Admitting: Internal Medicine

## 2017-11-20 ENCOUNTER — Ambulatory Visit: Payer: Self-pay | Admitting: Internal Medicine

## 2017-11-20 DIAGNOSIS — J449 Chronic obstructive pulmonary disease, unspecified: Secondary | ICD-10-CM

## 2017-11-20 DIAGNOSIS — J4489 Other specified chronic obstructive pulmonary disease: Secondary | ICD-10-CM

## 2017-11-20 DIAGNOSIS — J869 Pyothorax without fistula: Secondary | ICD-10-CM

## 2017-11-20 DIAGNOSIS — J9601 Acute respiratory failure with hypoxia: Secondary | ICD-10-CM

## 2017-11-20 DIAGNOSIS — J189 Pneumonia, unspecified organism: Secondary | ICD-10-CM

## 2017-11-20 HISTORY — DX: Other specified chronic obstructive pulmonary disease: J44.89

## 2017-11-20 HISTORY — DX: Pyothorax without fistula: J86.9

## 2017-11-20 HISTORY — DX: Chronic obstructive pulmonary disease, unspecified: J44.9

## 2017-11-20 LAB — STREP PNEUMONIAE URINARY ANTIGEN: STREP PNEUMO URINARY ANTIGEN: NEGATIVE

## 2017-11-20 LAB — PROCALCITONIN: Procalcitonin: <0.1 ng/mL

## 2017-11-20 MED ORDER — METHYLPREDNISOLONE SODIUM SUCC 125 MG IJ SOLR: 60.0000 mg | Freq: Four times a day (QID) | INTRAMUSCULAR | Status: DC

## 2017-11-20 MED ORDER — OMEGA-3-ACID ETHYL ESTERS 1 G PO CAPS
1.0000 g | ORAL_CAPSULE | Freq: Every day | ORAL | Status: DC
  Administered 2017-11-20 – 2017-11-22 (×3): 1 g via ORAL
  Filled 2017-11-20 (×3): qty 1

## 2017-11-20 MED ORDER — VANCOMYCIN HCL 10 G IV SOLR
1500.0000 mg | Freq: Two times a day (BID) | INTRAVENOUS | Status: DC
  Administered 2017-11-20 – 2017-11-22 (×5): 1500 mg via INTRAVENOUS
  Filled 2017-11-20 (×8): qty 1500

## 2017-11-20 MED ORDER — IBUPROFEN 400 MG PO TABS
800.0000 mg | ORAL_TABLET | Freq: Four times a day (QID) | ORAL | Status: DC | PRN
  Administered 2017-11-20: 800 mg via ORAL
  Filled 2017-11-20: qty 2

## 2017-11-20 MED ORDER — SODIUM CHLORIDE 0.9 % IV SOLN
2.0000 g | Freq: Two times a day (BID) | INTRAVENOUS | Status: DC
  Administered 2017-11-20 – 2017-11-22 (×4): 2 g via INTRAVENOUS
  Filled 2017-11-20 (×8): qty 2

## 2017-11-20 NOTE — Evaluation (Signed)
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  Here with PNA.  Clinical Impression  Pt did well with mobility and ambulation and was able to maintain O2 sats in the 90s on room air t/o the effort.  He was mid/low 90s the entire time with moderate subjective fatigue with stairs/prolonged ambulation.  Pt very anxious with his prolonged lung issues and consistent antibiotic use.  Pt does not need further PT intervention, but is adamant that he is not leaving until his lungs are clear.     Follow Up Recommendations No PT follow up    Equipment Recommendations       Recommendations for Other Services       Precautions / Restrictions Restrictions Weight Bearing Restrictions: No      Mobility  Bed Mobility Overal bed mobility: Independent             General bed mobility comments: Pt easily gets to EOB w/o assist  Transfers Overall transfer level: Independent Equipment used: None             General transfer comment: independent with rising to standing   Ambulation/Gait Ambulation/Gait assistance: Independent Ambulation Distance (Feet): 250 Feet Assistive device: None       General Gait Details: Pt walked with good speed and confidence, on room air O2 remained in the 90s (91% after prolonged ambulation)  Pt with no safety issues, good speed and good overall confidence with limited fatigue  Stairs Stairs: Yes Stairs assistance: Independent Stair Management: One rail Right Number of Stairs: 6 General stair comments: Pt with slightly increased fatigue with stair negotiation, but able to negotiate w/o issue  Wheelchair Mobility    Modified Rankin (Stroke Patients Only)       Balance Overall balance assessment: Independent                                            Pertinent Vitals/Pain Pain Assessment: (reports low back pressure from pneumonia)    Home Living Family/patient expects to be discharged to:: Private residence Living Arrangements: Spouse/significant other Available Help at Discharge: Family   Home Access: Stairs to enter   Secretary/administratorntrance Stairs-Number of Steps: 3 Home Layout: One level Home Equipment: None      Prior Function Level of Independence: Independent         Comments: Pt is able to do all he needs, just fatigues very quickly and has there for been very functionally limited     Hand Dominance        Extremity/Trunk Assessment   Upper Extremity Assessment Upper Extremity Assessment: Overall WFL for tasks assessed    Lower Extremity Assessment Lower Extremity Assessment: Overall WFL for tasks assessed       Communication   Communication: No difficulties  Cognition Arousal/Alertness: Awake/alert Behavior During Therapy: WFL for tasks assessed/performed Overall Cognitive Status: Within Functional Limits for tasks assessed                                        General Comments      Exercises     Assessment/Plan  PT Assessment Patent does not need any further PT services  PT Problem List         PT Treatment Interventions      PT Goals (Current goals can be found in the Care Plan section)  Acute Rehab PT Goals Patient Stated Goal: Not leave until the infection is out of his lungs PT Goal Formulation: All assessment and education complete, DC therapy    Frequency     Barriers to discharge        Co-evaluation               AM-PAC PT "6 Clicks" Daily Activity  Outcome Measure Difficulty turning over in bed (including adjusting bedclothes, sheets and blankets)?: None Difficulty moving from lying on back to sitting on the side of the bed? : None Difficulty sitting down on and standing up from a chair with arms (e.g., wheelchair, bedside  commode, etc,.)?: None Help needed moving to and from a bed to chair (including a wheelchair)?: None Help needed walking in hospital room?: None   6 Click Score: 20    End of Session Equipment Utilized During Treatment: Gait belt Activity Tolerance: Patient tolerated treatment well;Patient limited by fatigue Patient left: in chair;with call bell/phone within reach   PT Visit Diagnosis: Muscle weakness (generalized) (M62.81)    Time: 1610-9604 PT Time Calculation (min) (ACUTE ONLY): 27 min   Charges:   PT Evaluation $PT Eval Low Complexity: 1 Low     PT G CodesMalachi Pro, DPT 11/20/2017, 11:58 AM

## 2017-11-20 NOTE — Evaluation (Signed)
Clinical/Bedside Swallow Evaluation Patient Details  Name: Despina AriasJoseph W Schreifels MRN: [id number] Date of Birth: 03/30/1951  Today's Date: 11/20/2017 Time: SLP Start Time (ACUTE ONLY): 1220 SLP Stop Time (ACUTE ONLY): 1320 SLP Time Calculation (min) (ACUTE ONLY): 60 min  Past Medical History:  Past Medical History:  Diagnosis Date  . Empyema (HCC) 11/20/2017  . Essential hypertension   . Hyperglycemia   . Obstructive chronic bronchitis without exacerbation (HCC) 11/20/2017  . PSVT (paroxysmal supraventricular tachycardia) (HCC)    a. 10/2012-->coverted with adenosine in ED (6-->12mg );  b 10/2012 Neg MV, EF 59%; c. 10/2012 Echo: EF 55%, nl valves.  . Tobacco abuse    Past Surgical History:  Past Surgical History:  Procedure Laterality Date  . CHOLECYSTECTOMY    . TONSILLECTOMY     HPI:  Pt is a 67 y.o. 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 Farris HasKohn/pulmonology as an outpatient, was seen by Dr. Lennette BihariKohn and noted to have shortness of breath/dyspnea on exertion, CT of the chest noted for multifocal pneumonia, patient was sent to the emergency room for further evaluation/admission for HCAP. 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 Pt eats a regular diet at home and denies any swallowing problems. Pt does endorse Regurgitation and Vomiting d/t Esophageal/Diaphragm spasms and Hiatal Hernia - pt stated he 'stick my finger down my throat to encourage throwing up to relieve it". Any Regurgitation such as this can increase risk for aspiration of RELUX material.     Assessment / Plan / Recommendation Clinical Impression  Pt appears to present w/ adequate oropharyngeal  phase swallow function w/ reduced risk for aspiration w/ oral intake from an oropharyngeal phase standpoint. There is concern that pt may be at increased risk for aspiration of Regurgitation/Reflux material d/t Esophageal phase dysmotility. Pt consumed trials of thin liquids via straw and mech soft foods w/ no overt s/s of aspiration; no decline in vocal quality or significant decline in respiratory status during/post trials - pt did take rest breaks during bites of foods, and during talking, d/t mild SOB from exertion. During the oral phase, pt demonstrated adequate bolus control and oral clearing w/ all trials. He exhibited min increased mastication time d/t lacking dentition for mastication; given time, he cleared adequately. Pt does c/o Esophageal phase deficits including a Hiatal Hernia. Pt does endorse Regurgitation and Vomiting d/t Esophageal/Diaphragm spasms and the Hiatal Hernia - pt stated he 'stick my finger down my throat to encourage throwing up to relieve it". Any Regurgitation such as this can increase risk for aspiration of RELUX material during the vomiting. Recommend a more Mech Soft diet consistency(d/t edentulous status) w/ thin liquids; recommend general aspiration precautions; STRICT REFLUX PRECAUTIONS. Recommend Pills in Puree if easier for swallowing. Recommend f/u w/ GI for Esophageal dysmotility - for management/tx of issues to lessen regurgitation issues. NSG to reconsult ST services if indicated for any change in status while admitted. Pt and NSG updated, agreed. Handouts and education given to pt on Esophageal phase dysphagia and dysmotility; recommendations. SLP Visit Diagnosis: Dysphagia, pharyngoesophageal phase (R13.14)(Esophageal dysmotility)    Aspiration Risk  (reduced from an oropharyngeal phase standpoint)    Diet Recommendation  Mech soft diet consistency d/t edentulous status;  Thin liquids. General aspiration precautions; Strict REFLUX Precautions.   Medication  Administration: Whole meds with liquid(but use of Puree(applesauce) could be explored if easier)    Other  Recommendations Recommended Consults: Consider GI evaluation;Consider esophageal assessment(to address Esophageal phase dysmotility; Hiatal Hernia) Oral Care Recommendations: Oral care BID;Patient independent with oral care Other Recommendations: (n/a)   Follow up Recommendations None      Frequency and Duration (n/a)  (n/a)       Prognosis Prognosis for Safe Diet Advancement: Good Barriers to Reach Goals: (GI dysmotility; Reflux)      Swallow Study   General Date of Onset: 11/19/17 HPI: Pt is a 67 y.o. 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 Farris Has as an outpatient, was seen by Dr. Lennette Bihari and noted to have shortness of breath/dyspnea on exertion, CT of the chest noted for multifocal pneumonia, patient was sent to the emergency room for further evaluation/admission for HCAP. 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 Pt eats a regular diet at home and denies any swallowing problems. Pt does endorse Regurgitation and Vomiting d/t Esophageal/Diaphragm spasms and Hiatal Hernia - pt stated he 'stick my finger down my throat to encourage throwing up to relieve it". Any Regurgitation such as this can increase risk for aspiration of RELUX material.   Type of Study: Bedside Swallow Evaluation Previous Swallow Assessment: none reported Diet Prior to this Study: Regular;Thin liquids Temperature Spikes Noted: No(wbc 6.4) Respiratory Status: Nasal cannula(2 liters) History of Recent Intubation: No Behavior/Cognition:  Alert;Cooperative;Pleasant mood(talkative) Oral Cavity Assessment: Within Functional Limits Oral Care Completed by SLP: Recent completion by staff Oral Cavity - Dentition: Edentulous(doesn't wear the dentures) Vision: Functional for self-feeding Self-Feeding Abilities: Able to feed self Patient Positioning: Upright in chair Baseline Vocal Quality: Normal Volitional Cough: Strong Volitional Swallow: Able to elicit    Oral/Motor/Sensory Function Overall Oral Motor/Sensory Function: Within functional limits   Ice Chips Ice chips: Within functional limits Presentation: Spoon(2 trials)   Thin Liquid Thin Liquid: Within functional limits Presentation: Straw;Self Fed(several sips)    Nectar Thick Nectar Thick Liquid: Not tested   Honey Thick Honey Thick Liquid: Not tested   Puree Puree: Not tested   Solid   GO   Solid: Within functional limits(mech soft consistency foods, moistened) Presentation: Self Fed;Spoon(several trials)        Jerilynn Som, MS, CCC-SLP Matisse Roskelley 11/20/2017,2:45 PM

## 2017-11-20 NOTE — Progress Notes (Addendum)
Initial Nutrition Assessment  DOCUMENTATION CODES:      INTERVENTION:   Magic cup TID with meals, each supplement provides 290 kcal and 9 grams of protein  Double portions of protein on meal trays.   NUTRITION DIAGNOSIS:   Increased nutrient needs related to chronic illness as evidenced by increased estimated needs.  GOAL:   Patient will meet greater than or equal to 90% of their needs  MONITOR:   PO intake, Labs, I & O's, Weight trends  REASON FOR ASSESSMENT:   Malnutrition Screening Tool    ASSESSMENT:   67 y.o. male with a known history recent pneumonia 2 months ago where patient was hospitalized, completed 3-week course of Levaquin for pneumonia about a week ago, complicated long history with left lung collapse requiring decortication   Met with pt in room today. Pt was on a [place] and reported disliking the food and was frustrated because he couldn't order what he wanted. Informed pt that his diet would be liberalized per MD and he was pleased with this. Pt reported a good appetite currently and while at home. Pt reported consuming about 3 meals/day at home.  Pt is edentulous and does not have dentures with him. RD offered a chopped diet but pt refused saying he manages well with current food.  Per chart, pt has lost about 10 lbs (4.5%) over 3 months- this is not significant. RD suspects pt has not been eating enough to meet increased needs d/t recurrent PNA. Educated pt on importance of consuming enough calories and protein and pt verbalized understanding. Pt refused supplement stating he will only drink supplements "as a last resort."  RD will continue to follow.  Medications reviewed and include: clonidine, lovaza, ibuprofen, vancomycin, cefepime  Labs reviewed: All labs from 4/10: wbc 6.4 wnl, K 4.1 wnl, Ca 8.4(L)- corrected Ca 8.9 wnl  NUTRITION - FOCUSED PHYSICAL EXAM:    Most Recent Value  Orbital Region  No depletion  Upper Arm Region  Unable to assess   Thoracic and Lumbar Region  Unable to assess  Buccal Region  No depletion  Temple Region  Moderate depletion  Clavicle Bone Region  No depletion  Clavicle and Acromion Bone Region  No depletion  Scapular Bone Region  Unable to assess  Dorsal Hand  No depletion  Patellar Region  Unable to assess  Anterior Thigh Region  Unable to assess  Posterior Calf Region  Unable to assess  Edema (RD Assessment)  Moderate  Hair  Reviewed  Eyes  Reviewed  Mouth  Reviewed  Skin  Reviewed  Nails  Reviewed     Diet Order:  Diet regular Fluid consistency: Thin Aspiration precautions  EDUCATION NEEDS:   Education needs have been addressed  Skin:  Skin Assessment: Reviewed RN Assessment  Last BM:  11/20/2017  Height:   Ht Readings from Last 1 Encounters:  11/19/17 '6\' 2"'  (1.88 m)    Weight:   Wt Readings from Last 1 Encounters:  11/19/17 210 lb 14.4 oz (95.7 kg)    Ideal Body Weight:  95 kg  BMI:  Body mass index is 27.08 kg/m.  Estimated Nutritional Needs:   Kcal:  2200-2500 kcals  Protein:  114-134 g  Fluid:  >2.2 L  Alfonse Ras, [place] Dietetic Intern [phone]

## 2017-11-20 NOTE — Progress Notes (Signed)
[facility] [place] [facility]                                                                                                                                                                                  Patient Demographics   Aaron [name], is a 67 y.o. male, DOB - 02/15/1951, RUE:[id number]  Admit date - 11/19/2017   Admitting Physician Bertrum SolMontell D Salary, MD  Outpatient Primary MD for the patient is Carlean JewsBoscia, Heather E, NP   LOS - 1  Subjective: Patient admitted with shortness of breath progressively worse.     Review of Systems:   CONSTITUTIONAL: No documented fever. No fatigue, weakness. No weight gain, no weight loss.  EYES: No blurry or double vision.  ENT: No tinnitus. No postnasal drip. No redness of the oropharynx.  RESPIRATORY: No cough, no wheeze, no hemoptysis.  Positive dyspnea.  CARDIOVASCULAR: No chest pain. No orthopnea. No palpitations. No syncope.  GASTROINTESTINAL: No nausea, no vomiting or diarrhea. No abdominal pain. No melena or hematochezia.  GENITOURINARY: No dysuria or hematuria.  ENDOCRINE: No polyuria or nocturia. No heat or cold intolerance.  HEMATOLOGY: No anemia. No bruising. No bleeding.  INTEGUMENTARY: No rashes. No lesions.  MUSCULOSKELETAL: No arthritis. No swelling. No gout.  NEUROLOGIC: No numbness, tingling, or ataxia. No seizure-type activity.  PSYCHIATRIC: No anxiety. No insomnia. No ADD.    Vitals:   Vitals:   11/20/17 0813 11/20/17 1124 11/20/17 1609 11/20/17 1719  BP:    137/89  Pulse:    76  Resp:    19  Temp:    98 F (36.7 C)  TempSrc:    Oral  SpO2: 94% 94% 92% 90%  Weight:      Height:        Wt Readings from Last 3 Encounters:  11/19/17 95.7 kg (210 lb 14.4 oz)  11/13/17 96.6 kg (213 lb)  10/03/17 103.9 kg (229 lb)     Intake/Output Summary (Last 24 hours) at 11/20/2017 1810 Last data filed at 11/20/2017 0300 Gross per 24 hour  Intake 300 ml  Output 1525 ml  Net -1225 ml    Physical Exam:    GENERAL: Pleasant-appearing in no apparent distress.  HEAD, EYES, EARS, NOSE AND THROAT: Atraumatic, normocephalic. Extraocular muscles are intact. Pupils equal and reactive to light. Sclerae anicteric. No conjunctival injection. No oro-pharyngeal erythema.  NECK: Supple. There is no jugular venous distention. No bruits, no lymphadenopathy, no thyromegaly.  HEART: Regular rate and rhythm,. No murmurs, no rubs, no clicks.  LUNGS: Bilateral wheezing throughout both lung. No rales or rhonchi.   ABDOMEN: Soft, flat, nontender, nondistended. Has good bowel sounds. No hepatosplenomegaly appreciated.  EXTREMITIES: No evidence of any cyanosis, clubbing, or peripheral edema.  +2 pedal and radial pulses bilaterally.  NEUROLOGIC: The patient is alert, awake, and oriented x3 with no focal motor or sensory deficits appreciated bilaterally.  SKIN: Moist and warm with no rashes appreciated.  Psych: Not anxious, depressed LN: No inguinal LN enlargement    Antibiotics   Anti-infectives (From admission, onward)   Start     Dose/Rate Route Frequency Ordered Stop   11/20/17 1100  vancomycin (VANCOCIN) 1,500 mg in sodium chloride 0.9 % 500 mL IVPB     1,500 mg 250 mL/hr over 120 Minutes Intravenous Every 12 hours 11/20/17 0930     11/20/17 0930  ceFEPIme (MAXIPIME) 2 g in sodium chloride 0.9 % 100 mL IVPB     2 g 200 mL/hr over 30 Minutes Intravenous Every 12 hours 11/20/17 0930     11/20/17 0600  ceFEPIme (MAXIPIME) 2 g in sodium chloride 0.9 % 100 mL IVPB  Status:  Discontinued     2 g 200 mL/hr over 30 Minutes Intravenous Every 12 hours 11/19/17 1857 11/20/17 0930   11/20/17 0000  vancomycin (VANCOCIN) 1,500 mg in sodium chloride 0.9 % 500 mL IVPB  Status:  Discontinued     1,500 mg 250 mL/hr over 120 Minutes Intravenous Every 12 hours 11/19/17 1857 11/20/17 0930   11/19/17 2330  ceFEPIme (MAXIPIME) 1 g in sodium chloride 0.9 % 100 mL IVPB  Status:  Discontinued     1 g 200 mL/hr over 30 Minutes  Intravenous Every 8 hours 11/19/17 2320 11/20/17 0435   11/19/17 1745  ceFEPIme (MAXIPIME) 2 g in sodium chloride 0.9 % 100 mL IVPB     2 g 200 mL/hr over 30 Minutes Intravenous  Once 11/19/17 1739 11/19/17 1901   11/19/17 1745  vancomycin (VANCOCIN) IVPB 1000 mg/200 mL premix     1,000 mg 200 mL/hr over 60 Minutes Intravenous  Once 11/19/17 1739 11/19/17 1908      Medications   Scheduled Meds: . aspirin EC  81 mg Oral Daily  . budesonide (PULMICORT) nebulizer solution  0.5 mg Nebulization BID  . cloNIDine  0.1 mg Oral BID  . enoxaparin (LOVENOX) injection  40 mg Subcutaneous Q24H  . furosemide  40 mg Oral Daily  . ipratropium-albuterol  3 mL Nebulization QID  . omega-3 acid ethyl esters  1 g Oral Daily   Continuous Infusions: . ceFEPime (MAXIPIME) IV    . vancomycin Stopped (11/20/17 1403)   PRN Meds:.albuterol, ibuprofen   Data Review:   Micro Results Recent Results (from the past 240 hour(s))  Culture, blood (Routine x 2)     Status: None (Preliminary result)   Collection Time: 11/19/17  3:19 PM  Result Value Ref Range Status   Specimen Description BLOOD BLOOD LEFT HAND  Final   Special Requests Blood Culture adequate volume  Final   Culture   Final    NO GROWTH < 24 HOURS Performed at [facility], [address], [place], [place] [address]    Report Status PENDING  Incomplete  Culture, blood (Routine x 2)     Status: None (Preliminary result)   Collection Time: 11/19/17  3:24 PM  Result Value Ref Range Status   Specimen Description BLOOD RIGHT WRIST  Final   Special Requests   Final    BOTTLES DRAWN AEROBIC AND ANAEROBIC Blood Culture adequate volume   Culture   Final    NO GROWTH < 24 HOURS Performed at [facility], [address] [place], [place] [address]    Report Status PENDING  Incomplete    Radiology Reports Ct Angio Chest Pe W Or Wo Contrast  Result Date: 11/19/2017 CLINICAL DATA:  Shortness of breath 2 years. Symptoms have  worsened over the past 3 weeks. EXAM: CT ANGIOGRAPHY CHEST WITH CONTRAST TECHNIQUE: Multidetector CT imaging of the chest was performed using the standard protocol during bolus administration of intravenous contrast. Multiplanar CT image reconstructions and MIPs were obtained to evaluate the vascular anatomy. CONTRAST:  75 ml OMNIPAQUE IOHEXOL 350 MG/ML SOLN COMPARISON:  PA and lateral chest 09/15/2017 and 10/16/2015. CT chest 11/28/2015. FINDINGS: Cardiovascular: Satisfactory opacification of the pulmonary arteries to the segmental level. No evidence of pulmonary embolism. Normal heart size. No pericardial effusion. Scattered aortic atherosclerotic calcifications are identified. Mediastinum/Nodes: No enlarged mediastinal, hilar, or axillary lymph nodes. Thyroid gland, trachea, and esophagus demonstrate no significant findings. Lungs/Pleura: No pleural effusion. The lungs demonstrate emphysematous disease. There is new airspace opacity in the periphery of the right lung apex. A small focus of airspace opacity in the right upper lobe is contiguous with the major fissure. There is also new airspace opacity in the left lung base superimposed on chronic scar. Upper Abdomen: Negative. No acute bony abnormality. Plate and screw fixation of the left seventh rib noted. Musculoskeletal: Negative for pulmonary embolus. Review of the MIP images confirms the above findings. IMPRESSION: Negative for pulmonary embolus. Airspace disease in the right upper lobe and left lower lobe most consistent with multifocal pneumonia. No change in left basilar scar. Aortic Atherosclerosis (ICD10-I70.0) and Emphysema (ICD10-J43.9). Electronically Signed   By: Drusilla Kanner M.D.   On: 11/19/2017 12:58     CBC Recent Labs  Lab 11/19/17 1519  WBC 6.4  HGB 14.6  HCT 42.8  PLT 417  MCV 86.8  MCH 29.7  MCHC 34.2  RDW 13.9  LYMPHSABS 1.0  MONOABS 0.4  EOSABS 0.1  BASOSABS 0.0    Chemistries  Recent Labs  Lab 11/19/17 1229  11/19/17 1519  NA  --  136  K  --  4.1  CL  --  104  CO2  --  26  GLUCOSE  --  185*  BUN  --  15  CREATININE 0.80 0.90  CALCIUM  --  8.4*  AST  --  28  ALT  --  35  ALKPHOS  --  84  BILITOT  --  0.7   ------------------------------------------------------------------------------------------------------------------ estimated creatinine clearance is 92.6 mL/min (by C-G formula based on SCr of 0.9 mg/dL). ------------------------------------------------------------------------------------------------------------------ No results for input(s): HGBA1C in the last 72 hours. ------------------------------------------------------------------------------------------------------------------ No results for input(s): CHOL, HDL, LDLCALC, TRIG, CHOLHDL, LDLDIRECT in the last 72 hours. ------------------------------------------------------------------------------------------------------------------ No results for input(s): TSH, T4TOTAL, T3FREE, THYROIDAB in the last 72 hours.  Invalid input(s): FREET3 ------------------------------------------------------------------------------------------------------------------ No results for input(s): VITAMINB12, FOLATE, FERRITIN, TIBC, IRON, RETICCTPCT in the last 72 hours.  Coagulation profile Recent Labs  Lab 11/19/17 1519  INR 0.98    No results for input(s): DDIMER in the last 72 hours.  Cardiac Enzymes No results for input(s): CKMB, TROPONINI, MYOGLOBIN in the last 168 hours.  Invalid input(s): CK ------------------------------------------------------------------------------------------------------------------ Invalid input(s): POCBNP    Assessment & Plan  Patient is a 67 year old white male presenting with progressive shortness of breath  1.  Acute on hypoxic respiratory failure due to combination of COPD exasperation as well as possible pneumonia  2.  Acute on chronic COPD exasperation Continue duo nebs, Pulmicort I will add  Solu-Medrol  3.  H CAP continue IV antibiotics pro-calcitonin level is low however her pulmonologist seems to think that he has an acute infection  4.  Essential hypertension continue clonidine  5.  Nicotine abuse smoking cessation provided 4 minutes spent strongly recommend he stop smoking nicotine patch      Code Status Orders  (From admission, onward)        Start     Ordered   11/19/17 2321  Full code  Continuous     11/19/17 2320    Code Status History    Date Active Date Inactive Code Status Order ID Comments User Context   07/26/2015 1008 08/01/2015 2046 Full Code [id number]  Crissie Figures, MD Inpatient    Advance Directive Documentation     Most Recent Value  Type of Advance Directive  Living will  Pre-existing out of facility DNR order (yellow form or pink MOST form)  -  "MOST" Form in Place?  -           Consults pulmonary   DVT Prophylaxis  Lovenox  Lab Results  Component Value Date   PLT 417 11/19/2017     Time Spent in minutes   35 minutes spent  Greater than 50% of time spent in care coordination and counseling patient regarding the condition and plan of care.   Auburn Bilberry M.D on 11/20/2017 at 6:10 PM  Between 7am to 6pm - Pager - [phone]  After 6pm go to [email or web address] - Social research officer, government  [employer]   Office  [phone]

## 2017-11-20 NOTE — Consult Note (Signed)
Pulmonary Critical Care  Initial Consult Note  Aaron AriasJoseph W Odland ZOX:[id number] DOB: 09/22/1950 DOA: 11/19/2017  Referring physician: Dr. Katheren ShamsSalary  Chief Complaint: Pneumonia shortness of breath  HPI: Aaron Luna is a 67 y.o. male who was seen in the office recently with complaints of increasing shortness of breath.  Patient came to my office after a recent travel down to [place].  While he was there he became sick and was seen in an urgent care.  Patient was given antibiotics and sent home.  He stated to me that he was having increased symptoms with shortness of breath cough congestion.  Concern was raised that he may also have possible pulmonary embolism since he did have a recent drive so we ordered a CT scan of his chest.  Although the CT scan of the chest was negative for pulmonary embolism he did have diffuse airspace disease in the right upper lobe as well as the left lower lobe suggestive of multifocal pneumonia.  When the results were called patient was instructed to come into the emergency room for admission and further therapy.  On presentation to the ED he was noted to be significantly hypoxic with saturations in the 70s.  Patient was admitted for severe hypoxemic respiratory failure and bilateral pneumonia  Review of Systems:  Constitutional:  No weight loss, night sweats, Fevers, chills, +fatigue.  HEENT:  No headaches, nasal congestion, post nasal drip,  Cardio-vascular:  No chest pain, Orthopnea, PND, swelling in lower extremities, anasarca, dizziness, palpitations  GI:  No heartburn, indigestion, abdominal pain, nausea, vomiting, diarrhea  Resp:  +shortness of breath. +productive cough, no coughing up of blood. +wheezing Skin:  no rash or lesions.  Musculoskeletal:  No joint pain or swelling.   Remainder ROS performed and is unremarkable other than noted in HPI  Past Medical History:  Diagnosis Date  . Essential hypertension   . Hyperglycemia   . PSVT  (paroxysmal supraventricular tachycardia) (HCC)    a. 10/2012-->coverted with adenosine in ED (6-->12mg );  b 10/2012 Neg MV, EF 59%; c. 10/2012 Echo: EF 55%, nl valves.  . Tobacco abuse    Past Surgical History:  Procedure Laterality Date  . CHOLECYSTECTOMY    . TONSILLECTOMY     Social History:  reports that he has quit smoking. His smoking use included cigarettes. He quit smokeless tobacco use about 2 years ago. He reports that he drank alcohol. He reports that he does not use drugs.  Allergies  Allergen Reactions  . Percocet [Oxycodone-Acetaminophen]     Non-specific but seriously "sick"  . Prednisone     Family History  Problem Relation Age of Onset  . Heart disease Father 2456       CABG x 4  . Hypertension Sister     Prior to Admission medications   Medication Sig Start Date End Date Taking? Authorizing Provider  Cannabidiol 100 MG/ML SOLN Take by mouth as directed. For pain   Yes [provider]  cloNIDine (CATAPRES) 0.1 MG tablet Take 1 tablet (0.1 mg total) by mouth 2 (two) times daily. 10/03/17  Yes Boscia, Kathlynn GrateHeather E, NP  docusate sodium (COLACE) 100 MG capsule Take 100 mg by mouth 2 (two) times daily.   Yes [provider]  magnesium gluconate (MAGONATE) 500 MG tablet Take 500 mg by mouth 2 (two) times daily.   Yes [provider]  Tiotropium Bromide-Olodaterol (STIOLTO RESPIMAT) 2.5-2.5 MCG/ACT AERS Inhale 1 puff into the lungs daily. 11/13/17  Yes Welton FlakesKhan, Linzee Depaul A,  MD  albuterol (PROVENTIL HFA;VENTOLIN HFA) 108 (90 Base) MCG/ACT inhaler Inhale 2 puffs into the lungs every 4 (four) hours as needed for wheezing or shortness of breath. Patient not taking: Reported on 11/19/2017 09/01/17   Carlean Jews, NP  ibuprofen (ADVIL,MOTRIN) 400 MG tablet Take 1 tablet (400 mg total) by mouth every 6 (six) hours as needed. Patient not taking: Reported on 11/19/2017 08/01/15   Gale Journey, MD   Physical Exam: Vitals:   11/20/17 0348 11/20/17 0730  11/20/17 0813 11/20/17 1124  BP: 128/75 128/82    Pulse: 82 74    Resp: 18 20    Temp: 98.6 F (37 C) 98.3 F (36.8 C)    TempSrc:  Oral    SpO2: 93% 94% 94% 94%  Weight:      Height:        Wt Readings from Last 3 Encounters:  11/19/17 210 lb 14.4 oz (95.7 kg)  11/13/17 213 lb (96.6 kg)  10/03/17 229 lb (103.9 kg)    General:  Appears calm and comfortable Eyes: PERRL, normal lids, irises & conjunctiva ENT: grossly normal hearing, lips & tongue Neck: no LAD, masses or thyromegaly Cardiovascular: RRR, no m/r/g. No LE edema. Respiratory: CTA bilaterally, no w/r/r.       Normal respiratory effort. Abdomen: soft, nontender Skin: no rash or induration seen on limited exam Musculoskeletal: grossly normal tone BUE/BLE Psychiatric: grossly normal mood and affect Neurologic: grossly non-focal.          Labs on Admission:  Basic Metabolic Panel: Recent Labs  Lab 11/19/17 1229 11/19/17 1519  NA  --  136  K  --  4.1  CL  --  104  CO2  --  26  GLUCOSE  --  185*  BUN  --  15  CREATININE 0.80 0.90  CALCIUM  --  8.4*   Liver Function Tests: Recent Labs  Lab 11/19/17 1519  AST 28  ALT 35  ALKPHOS 84  BILITOT 0.7  PROT 6.7  ALBUMIN 3.4*   No results for input(s): LIPASE, AMYLASE in the last 168 hours. No results for input(s): AMMONIA in the last 168 hours. CBC: Recent Labs  Lab 11/19/17 1519  WBC 6.4  NEUTROABS 4.8  HGB 14.6  HCT 42.8  MCV 86.8  PLT 417   Cardiac Enzymes: No results for input(s): CKTOTAL, CKMB, CKMBINDEX, TROPONINI in the last 168 hours.  BNP (last 3 results) No results for input(s): BNP in the last 8760 hours.  ProBNP (last 3 results) No results for input(s): PROBNP in the last 8760 hours.  CBG: No results for input(s): GLUCAP in the last 168 hours.  Radiological Exams on Admission: Ct Angio Chest Pe W Or Wo Contrast  Result Date: 11/19/2017 CLINICAL DATA:  Shortness of breath 2 years. Symptoms have worsened over the past 3  weeks. EXAM: CT ANGIOGRAPHY CHEST WITH CONTRAST TECHNIQUE: Multidetector CT imaging of the chest was performed using the standard protocol during bolus administration of intravenous contrast. Multiplanar CT image reconstructions and MIPs were obtained to evaluate the vascular anatomy. CONTRAST:  75 ml OMNIPAQUE IOHEXOL 350 MG/ML SOLN COMPARISON:  PA and lateral chest 09/15/2017 and 10/16/2015. CT chest 11/28/2015. FINDINGS: Cardiovascular: Satisfactory opacification of the pulmonary arteries to the segmental level. No evidence of pulmonary embolism. Normal heart size. No pericardial effusion. Scattered aortic atherosclerotic calcifications are identified. Mediastinum/Nodes: No enlarged mediastinal, hilar, or axillary lymph nodes. Thyroid gland, trachea, and esophagus demonstrate no significant findings. Lungs/Pleura: No pleural effusion.  The lungs demonstrate emphysematous disease. There is new airspace opacity in the periphery of the right lung apex. A small focus of airspace opacity in the right upper lobe is contiguous with the major fissure. There is also new airspace opacity in the left lung base superimposed on chronic scar. Upper Abdomen: Negative. No acute bony abnormality. Plate and screw fixation of the left seventh rib noted. Musculoskeletal: Negative for pulmonary embolus. Review of the MIP images confirms the above findings. IMPRESSION: Negative for pulmonary embolus. Airspace disease in the right upper lobe and left lower lobe most consistent with multifocal pneumonia. No change in left basilar scar. Aortic Atherosclerosis (ICD10-I70.0) and Emphysema (ICD10-J43.9). Electronically Signed   By: Drusilla Kanner M.D.   On: 11/19/2017 12:58    EKG: Independently reviewed.  Assessment/Plan Active Problems:   Tobacco abuse   Acute hypoxemic respiratory failure (HCC)   HCAP (healthcare-associated pneumonia)   Obstructive chronic bronchitis without exacerbation (HCC)   Empyema (HCC)   1. Acute on  chronic respiratory failure with hypoxia patient had significant hypoxemia noted on admission.  Now is doing a little bit better we need to continue with IV antibiotics for the pneumonia as already noted.  Patient will be continued with oxygen therapy.  Continue with respiratory therapy and inhalers and nebulizers as needed 2. COPD with acute exacerbation at this time patient is going to be continued on current therapy currently is on Proventil which will be continued.  In addition patient is on Pulmicort nebulizer and DuoNeb's as needed. 3. Healthcare associated pneumonia patient is being treated with cefepime as well as vancomycin pharmacy is adjusting the doses accordingly 4. History of empyema status post decortication we will continue to follow closely he had actually been doing relatively well up until this acute pneumonia 5. History of nicotine abuse supportive care cessation since 2016  Code Status: Full code  Family Communication: Wife Disposition Plan: Home  Time spent: 70 minutes  I have personally obtained a history, examined the patient, evaluated laboratory and imaging results, formulated the assessment and plan and placed orders.  The Patient requires high complexity decision making for assessment and support. Total Time Spent   Yevonne Pax, MD [facility] Pulmonary Critical Care Medicine Sleep Medicine

## 2017-11-20 NOTE — Progress Notes (Signed)
RN attempted to obtain MRSA PCR- pt refusing at this time/ stated " they have done that 3 times, my nose is dry"/will attempted attempt again later

## 2017-11-21 LAB — BASIC METABOLIC PANEL WITH GFR
Anion gap: 5 (ref 5–15)
BUN: 14 mg/dL (ref 6–20)
CO2: 26 mmol/L (ref 22–32)
Calcium: 8.4 mg/dL — ABNORMAL LOW (ref 8.9–10.3)
Chloride: 108 mmol/L (ref 101–111)
Creatinine, Ser: 0.81 mg/dL (ref 0.61–1.24)
GFR calc Af Amer: >60 mL/min
GFR calc non Af Amer: >60 mL/min
Glucose, Bld: 97 mg/dL (ref 65–99)
Potassium: 4.3 mmol/L (ref 3.5–5.1)
Sodium: 139 mmol/L (ref 135–145)

## 2017-11-21 LAB — CBC
HEMATOCRIT: 41.2 % (ref 40.0–52.0)
Hemoglobin: 14 g/dL (ref 13.0–18.0)
MCH: 29.9 pg (ref 26.0–34.0)
MCHC: 34.1 g/dL (ref 32.0–36.0)
MCV: 87.9 fL (ref 80.0–100.0)
Platelets: 380 10*3/uL (ref 150–440)
RBC: 4.68 MIL/uL (ref 4.40–5.90)
RDW: 14 % (ref 11.5–14.5)
WBC: 6.2 10*3/uL (ref 3.8–10.6)

## 2017-11-21 LAB — LEGIONELLA PNEUMOPHILA SEROGP 1 UR AG: L. PNEUMOPHILA SEROGP 1 UR AG: NEGATIVE

## 2017-11-21 LAB — HIV ANTIBODY (ROUTINE TESTING W REFLEX): HIV Screen 4th Generation wRfx: NONREACTIVE

## 2017-11-21 LAB — PROCALCITONIN: Procalcitonin: <0.1 ng/mL

## 2017-11-21 MED ORDER — SODIUM CHLORIDE 0.9% FLUSH: 10.0000 mL | INTRAVENOUS | Status: DC | PRN

## 2017-11-21 MED ORDER — IPRATROPIUM-ALBUTEROL 0.5-2.5 (3) MG/3ML IN SOLN
3.0000 mL | Freq: Three times a day (TID) | RESPIRATORY_TRACT | Status: DC
  Administered 2017-11-21 – 2017-11-22 (×3): 3 mL via RESPIRATORY_TRACT
  Filled 2017-11-21 (×3): qty 3

## 2017-11-21 NOTE — Progress Notes (Signed)
Right forearm IV removed 2/2 leaking. RN attempted once to place PIV, unsuccessful. IV team consulted. I will continue to assess.

## 2017-11-21 NOTE — Progress Notes (Signed)
SATURATION QUALIFICATIONS: (This note is used to comply with regulatory documentation for home oxygen)  Patient Saturations on Room Air at Rest = 95%  Patient Saturations on Room Air while Ambulating = 92%  Patient Saturations on 0 Liters of oxygen while Ambulating = n/a%  Please briefly explain why patient needs home oxygen: 

## 2017-11-21 NOTE — Plan of Care (Signed)
  Problem: Clinical Measurements: Goal: Diagnostic test results will improve Outcome: Progressing   Problem: Respiratory: Goal: Ability to maintain adequate ventilation will improve Outcome: Progressing   

## 2017-11-21 NOTE — Progress Notes (Signed)
[employer] - [place] at [facility]                                                                                                                                                                                  Patient Demographics   Aaron Luna, is a 67 y.o. male, DOB - Apr 25, 1951, ZOX:[id number]  Admit date - 11/19/2017   Admitting Physician Bertrum Sol, MD  Outpatient Primary MD for the patient is Carlean Jews, NP   LOS - 2  Subjective: Patient feeling better refuses steroids and Lasix    Review of Systems:   CONSTITUTIONAL: No documented fever. No fatigue, weakness. No weight gain, no weight loss.  EYES: No blurry or double vision.  ENT: No tinnitus. No postnasal drip. No redness of the oropharynx.  RESPIRATORY: No cough, no wheeze, no hemoptysis.  Positive dyspnea.  CARDIOVASCULAR: No chest pain. No orthopnea. No palpitations. No syncope.  GASTROINTESTINAL: No nausea, no vomiting or diarrhea. No abdominal pain. No melena or hematochezia.  GENITOURINARY: No dysuria or hematuria.  ENDOCRINE: No polyuria or nocturia. No heat or cold intolerance.  HEMATOLOGY: No anemia. No bruising. No bleeding.  INTEGUMENTARY: No rashes. No lesions.  MUSCULOSKELETAL: No arthritis. No swelling. No gout.  NEUROLOGIC: No numbness, tingling, or ataxia. No seizure-type activity.  PSYCHIATRIC: No anxiety. No insomnia. No ADD.    Vitals:   Vitals:   11/21/17 0538 11/21/17 0843 11/21/17 1107 11/21/17 1502  BP: 139/76 (!) 150/80    Pulse: 87 79    Resp:  18    Temp: 98.3 F (36.8 C) 98.1 F (36.7 C)    TempSrc: Oral Oral    SpO2: 91% 94% 90% 91%  Weight:      Height:        Wt Readings from Last 3 Encounters:  11/21/17 94.9 kg (209 lb 3.2 oz)  11/13/17 96.6 kg (213 lb)  10/03/17 103.9 kg (229 lb)     Intake/Output Summary (Last 24 hours) at 11/21/2017 1702 Last data filed at 11/21/2017 1347 Gross per 24 hour  Intake 1320 ml  Output 2775 ml  Net -1455 ml     Physical Exam:   GENERAL: Pleasant-appearing in no apparent distress.  HEAD, EYES, EARS, NOSE AND THROAT: Atraumatic, normocephalic. Extraocular muscles are intact. Pupils equal and reactive to light. Sclerae anicteric. No conjunctival injection. No oro-pharyngeal erythema.  NECK: Supple. There is no jugular venous distention. No bruits, no lymphadenopathy, no thyromegaly.  HEART: Regular rate and rhythm,. No murmurs, no rubs, no clicks.  LUNGS: Decreased breath sounds bilaterally. No rales or rhonchi.   ABDOMEN: Soft, flat, nontender, nondistended. Has good bowel sounds. No hepatosplenomegaly appreciated.  EXTREMITIES: No evidence of any cyanosis, clubbing, or peripheral edema.  +2 pedal and radial pulses bilaterally.  NEUROLOGIC: The patient is alert, awake, and oriented x3 with no focal motor or sensory deficits appreciated bilaterally.  SKIN: Moist and warm with no rashes appreciated.  Psych: Not anxious, depressed LN: No inguinal LN enlargement    Antibiotics   Anti-infectives (From admission, onward)   Start     Dose/Rate Route Frequency Ordered Stop   11/20/17 1100  vancomycin (VANCOCIN) 1,500 mg in sodium chloride 0.9 % 500 mL IVPB     1,500 mg 250 mL/hr over 120 Minutes Intravenous Every 12 hours 11/20/17 0930     11/20/17 0930  ceFEPIme (MAXIPIME) 2 g in sodium chloride 0.9 % 100 mL IVPB     2 g 200 mL/hr over 30 Minutes Intravenous Every 12 hours 11/20/17 0930     11/20/17 0600  ceFEPIme (MAXIPIME) 2 g in sodium chloride 0.9 % 100 mL IVPB  Status:  Discontinued     2 g 200 mL/hr over 30 Minutes Intravenous Every 12 hours 11/19/17 1857 11/20/17 0930   11/20/17 0000  vancomycin (VANCOCIN) 1,500 mg in sodium chloride 0.9 % 500 mL IVPB  Status:  Discontinued     1,500 mg 250 mL/hr over 120 Minutes Intravenous Every 12 hours 11/19/17 1857 11/20/17 0930   11/19/17 2330  ceFEPIme (MAXIPIME) 1 g in sodium chloride 0.9 % 100 mL IVPB  Status:  Discontinued     1 g 200 mL/hr  over 30 Minutes Intravenous Every 8 hours 11/19/17 2320 11/20/17 0435   11/19/17 1745  ceFEPIme (MAXIPIME) 2 g in sodium chloride 0.9 % 100 mL IVPB     2 g 200 mL/hr over 30 Minutes Intravenous  Once 11/19/17 1739 11/19/17 1901   11/19/17 1745  vancomycin (VANCOCIN) IVPB 1000 mg/200 mL premix     1,000 mg 200 mL/hr over 60 Minutes Intravenous  Once 11/19/17 1739 11/19/17 1908      Medications   Scheduled Meds: . aspirin EC  81 mg Oral Daily  . budesonide (PULMICORT) nebulizer solution  0.5 mg Nebulization BID  . cloNIDine  0.1 mg Oral BID  . enoxaparin (LOVENOX) injection  40 mg Subcutaneous Q24H  . ipratropium-albuterol  3 mL Nebulization TID  . methylPREDNISolone (SOLU-MEDROL) injection  60 mg Intravenous Q6H  . omega-3 acid ethyl esters  1 g Oral Daily   Continuous Infusions: . ceFEPime (MAXIPIME) IV 2 g (11/21/17 0946)  . vancomycin 1,500 mg (11/21/17 1113)   PRN Meds:.albuterol, ibuprofen, sodium chloride flush   Data Review:   Micro Results Recent Results (from the past 240 hour(s))  Culture, blood (Routine x 2)     Status: None (Preliminary result)   Collection Time: 11/19/17  3:19 PM  Result Value Ref Range Status   Specimen Description BLOOD BLOOD LEFT HAND  Final   Special Requests Blood Culture adequate volume  Final   Culture   Final    NO GROWTH 2 DAYS Performed at [facility], [address] [place], [place] [address]    Report Status PENDING  Incomplete  Culture, blood (Routine x 2)     Status: None (Preliminary result)   Collection Time: 11/19/17  3:24 PM  Result Value Ref Range Status   Specimen Description BLOOD RIGHT WRIST  Final   Special Requests   Final    BOTTLES DRAWN AEROBIC AND ANAEROBIC Blood Culture adequate volume   Culture   Final    NO  GROWTH 2 DAYS Performed at [facility], [address] [place], [place] [address]    Report Status PENDING  Incomplete    Radiology Reports Ct Angio Chest Pe W Or Wo  Contrast  Result Date: 11/19/2017 CLINICAL DATA:  Shortness of breath 2 years. Symptoms have worsened over the past 3 weeks. EXAM: CT ANGIOGRAPHY CHEST WITH CONTRAST TECHNIQUE: Multidetector CT imaging of the chest was performed using the standard protocol during bolus administration of intravenous contrast. Multiplanar CT image reconstructions and MIPs were obtained to evaluate the vascular anatomy. CONTRAST:  75 ml OMNIPAQUE IOHEXOL 350 MG/ML SOLN COMPARISON:  PA and lateral chest 09/15/2017 and 10/16/2015. CT chest 11/28/2015. FINDINGS: Cardiovascular: Satisfactory opacification of the pulmonary arteries to the segmental level. No evidence of pulmonary embolism. Normal heart size. No pericardial effusion. Scattered aortic atherosclerotic calcifications are identified. Mediastinum/Nodes: No enlarged mediastinal, hilar, or axillary lymph nodes. Thyroid gland, trachea, and esophagus demonstrate no significant findings. Lungs/Pleura: No pleural effusion. The lungs demonstrate emphysematous disease. There is new airspace opacity in the periphery of the right lung apex. A small focus of airspace opacity in the right upper lobe is contiguous with the major fissure. There is also new airspace opacity in the left lung base superimposed on chronic scar. Upper Abdomen: Negative. No acute bony abnormality. Plate and screw fixation of the left seventh rib noted. Musculoskeletal: Negative for pulmonary embolus. Review of the MIP images confirms the above findings. IMPRESSION: Negative for pulmonary embolus. Airspace disease in the right upper lobe and left lower lobe most consistent with multifocal pneumonia. No change in left basilar scar. Aortic Atherosclerosis (ICD10-I70.0) and Emphysema (ICD10-J43.9). Electronically Signed   By: Drusilla Kanner M.D.   On: 11/19/2017 12:58     CBC Recent Labs  Lab 11/19/17 1519 11/21/17 0548  WBC 6.4 6.2  HGB 14.6 14.0  HCT 42.8 41.2  PLT 417 380  MCV 86.8 87.9  MCH 29.7  29.9  MCHC 34.2 34.1  RDW 13.9 14.0  LYMPHSABS 1.0  --   MONOABS 0.4  --   EOSABS 0.1  --   BASOSABS 0.0  --     Chemistries  Recent Labs  Lab 11/19/17 1229 11/19/17 1519 11/21/17 0548  NA  --  136 139  K  --  4.1 4.3  CL  --  104 108  CO2  --  26 26  GLUCOSE  --  185* 97  BUN  --  15 14  CREATININE 0.80 0.90 0.81  CALCIUM  --  8.4* 8.4*  AST  --  28  --   ALT  --  35  --   ALKPHOS  --  84  --   BILITOT  --  0.7  --    ------------------------------------------------------------------------------------------------------------------ estimated creatinine clearance is 102.9 mL/min (by C-G formula based on SCr of 0.81 mg/dL). ------------------------------------------------------------------------------------------------------------------ No results for input(s): HGBA1C in the last 72 hours. ------------------------------------------------------------------------------------------------------------------ No results for input(s): CHOL, HDL, LDLCALC, TRIG, CHOLHDL, LDLDIRECT in the last 72 hours. ------------------------------------------------------------------------------------------------------------------ No results for input(s): TSH, T4TOTAL, T3FREE, THYROIDAB in the last 72 hours.  Invalid input(s): FREET3 ------------------------------------------------------------------------------------------------------------------ No results for input(s): VITAMINB12, FOLATE, FERRITIN, TIBC, IRON, RETICCTPCT in the last 72 hours.  Coagulation profile Recent Labs  Lab 11/19/17 1519  INR 0.98    No results for input(s): DDIMER in the last 72 hours.  Cardiac Enzymes No results for input(s): CKMB, TROPONINI, MYOGLOBIN in the last 168 hours.  Invalid input(s): CK ------------------------------------------------------------------------------------------------------------------ Invalid input(s): POCBNP  Assessment & Plan  Patient is a 67 year old white male presenting with  progressive shortness of breath  1.  Acute on hypoxic respiratory failure due to combination of COPD exasperation as well as possible pneumonia Continue IV antibiotics as per pulmonary  2.  Acute on chronic COPD exasperation Continue duo nebs, Pulmicort I will add Solu-Medrol  3.  H CAP continue IV antibiotics pro-calcitonin level is low however his pulmonologist seems to think that he has an acute infection  4.  Essential hypertension continue clonidine  5.  Nicotine abuse smoking cessation provided 4 minutes spent strongly recommend he stop smoking nicotine patch      Code Status Orders  (From admission, onward)        Start     Ordered   11/19/17 2321  Full code  Continuous     11/19/17 2320    Code Status History    Date Active Date Inactive Code Status Order ID Comments User Context   07/26/2015 1008 08/01/2015 2046 Full Code [id number]  Crissie Figures, MD Inpatient    Advance Directive Documentation     Most Recent Value  Type of Advance Directive  Living will  Pre-existing out of facility DNR order (yellow form or pink MOST form)  -  "MOST" Form in Place?  -           Consults pulmonary   DVT Prophylaxis  Lovenox  Lab Results  Component Value Date   PLT 380 11/21/2017     Time Spent in minutes   35 minutes spent  Greater than 50% of time spent in care coordination and counseling patient regarding the condition and plan of care.   Aaron Luna M.D on 11/21/2017 at 5:02 PM  Between 7am to 6pm - Pager - [phone]  After 6pm go to [email or web address] - Social research officer, government  [employer]   Office  [phone]

## 2017-11-22 MED ORDER — AMOXICILLIN-POT CLAVULANATE 875-125 MG PO TABS: 1.0000 | ORAL_TABLET | Freq: Two times a day (BID) | ORAL | Status: DC

## 2017-11-22 MED ORDER — AMOXICILLIN-POT CLAVULANATE 875-125 MG PO TABS: 1.0000 | ORAL_TABLET | Freq: Two times a day (BID) | ORAL | 0 refills | Status: DC

## 2017-11-22 MED ORDER — DOXYCYCLINE HYCLATE 100 MG PO TABS
100.0000 mg | ORAL_TABLET | Freq: Two times a day (BID) | ORAL | Status: DC
  Filled 2017-11-22: qty 1

## 2017-11-22 MED ORDER — DOXYCYCLINE HYCLATE 100 MG PO TABS: 100.0000 mg | ORAL_TABLET | Freq: Two times a day (BID) | ORAL | 0 refills | Status: DC

## 2017-11-22 MED ORDER — ALBUTEROL SULFATE HFA 108 (90 BASE) MCG/ACT IN AERS: 2.0000 | INHALATION_SPRAY | RESPIRATORY_TRACT | 0 refills | Status: AC | PRN

## 2017-11-22 NOTE — Progress Notes (Signed)
Discontinued midline, catheter intact. Patient with no complaints. Patient discharged with spouse. Patient with no complaints, verbalized understanding of education.

## 2017-11-22 NOTE — Discharge Summary (Signed)
[employer] - [place] at [facility]   PATIENT NAME: Aaron Luna    MR#:  [id number]  DATE OF BIRTH:  10/20/1950  DATE OF ADMISSION:  11/19/2017 ADMITTING PHYSICIAN: Bertrum SolMontell D Salary, MD  DATE OF DISCHARGE: 11/22/2017  PRIMARY CARE PHYSICIAN: Carlean JewsBoscia, Heather E, NP    ADMISSION DIAGNOSIS:  Dyspnea on exertion [R06.09] Acute respiratory failure with hypoxia (HCC) [J96.01] HCAP (healthcare-associated pneumonia) [J18.9]  DISCHARGE DIAGNOSIS:  Active Problems:   Tobacco abuse   Acute hypoxemic respiratory failure (HCC)   HCAP (healthcare-associated pneumonia)   Obstructive chronic bronchitis without exacerbation (HCC)   Empyema (HCC)   SECONDARY DIAGNOSIS:   Past Medical History:  Diagnosis Date  . Empyema (HCC) 11/20/2017  . Essential hypertension   . Hyperglycemia   . Obstructive chronic bronchitis without exacerbation (HCC) 11/20/2017  . PSVT (paroxysmal supraventricular tachycardia) (HCC)    a. 10/2012-->coverted with adenosine in ED (6-->12mg );  b 10/2012 Neg MV, EF 59%; c. 10/2012 Echo: EF 55%, nl valves.  . Tobacco abuse     HOSPITAL COURSE:   1.  Acute  hypoxic respiratory failure.  Patient required oxygen on presentation.  Now breathing comfortably off oxygen. 2.  Multifocal pneumonia.  CT scan showed pneumonia in the right upper lobe and left lower lobe.  Patient was on Maxipime and vancomycin while here.  I will switch over to Augmentin and doxycycline to finish up a 7-day course.  Follow-up with pulmonary as outpatient.   Of note the patient's pro-calcitonin was negative which usually goes along with a viral infection versus a atypical infection.  3.  COPD exacerbation.  Patient was refusing Solu-Medrol while here in the hospital.  I am not hearing any wheezing.  Patient will go back on his inhalers at home. 4.  Essential hypertension on clonidine 5.  Tobacco abuse.  Counseling during the hospital course by my associate.  DISCHARGE CONDITIONS:    Satisfactory  CONSULTS OBTAINED:  Treatment Team:  Yevonne PaxKhan, Saadat A, MD  DRUG ALLERGIES:   Allergies  Allergen Reactions  . Percocet [Oxycodone-Acetaminophen]     Non-specific but seriously "sick"  . Prednisone     Patient states he has excruciating pain across abdomen and lower back      DISCHARGE MEDICATIONS:   Allergies as of 11/22/2017      Reactions   Percocet [oxycodone-acetaminophen]    Non-specific but seriously "sick"   Prednisone    Patient states he has excruciating pain across abdomen and lower back       Medication List    STOP taking these medications   Cannabidiol 100 MG/ML Soln   ibuprofen 400 MG tablet Commonly known as:  ADVIL,MOTRIN     TAKE these medications   albuterol 108 (90 Base) MCG/ACT inhaler Commonly known as:  PROVENTIL HFA;VENTOLIN HFA Inhale 2 puffs into the lungs every 4 (four) hours as needed for wheezing or shortness of breath.   amoxicillin-clavulanate 875-125 MG tablet Commonly known as:  AUGMENTIN Take 1 tablet by mouth every 12 (twelve) hours.   cloNIDine 0.1 MG tablet Commonly known as:  CATAPRES Take 1 tablet (0.1 mg total) by mouth 2 (two) times daily.   docusate sodium 100 MG capsule Commonly known as:  COLACE Take 100 mg by mouth 2 (two) times daily.   doxycycline 100 MG tablet Commonly known as:  VIBRA-TABS Take 1 tablet (100 mg total) by mouth every 12 (twelve) hours.   magnesium gluconate 500 MG tablet Commonly known as:  MAGONATE Take 500  mg by mouth 2 (two) times daily.   Tiotropium Bromide-Olodaterol 2.5-2.5 MCG/ACT Aers Commonly known as:  STIOLTO RESPIMAT Inhale 1 puff into the lungs daily.        DISCHARGE INSTRUCTIONS:    Follow-up PMD 6 days Follow-up with Dr. Freda Munro pulmonary in 1 week  If you experience worsening of your admission symptoms, develop shortness of breath, life threatening emergency, suicidal or homicidal thoughts you must seek medical attention immediately by calling  911 or calling your MD immediately  if symptoms less severe.  You Must read complete instructions/literature along with all the possible adverse reactions/side effects for all the Medicines you take and that have been prescribed to you. Take any new Medicines after you have completely understood and accept all the possible adverse reactions/side effects.   Please note  You were cared for by a hospitalist during your hospital stay. If you have any questions about your discharge medications or the care you received while you were in the hospital after you are discharged, you can call the unit and asked to speak with the hospitalist on call if the hospitalist that took care of you is not available. Once you are discharged, your primary care physician will handle any further medical issues. Please note that NO REFILLS for any discharge medications will be authorized once you are discharged, as it is imperative that you return to your primary care physician (or establish a relationship with a primary care physician if you do not have one) for your aftercare needs so that they can reassess your need for medications and monitor your lab values.    Today   CHIEF COMPLAINT:   Chief Complaint  Patient presents with  . Pneumonia    HISTORY OF PRESENT ILLNESS:  Aaron Luna  is a 67 y.o. male sent in for pneumonia and hypoxia   VITAL SIGNS:  Blood pressure 115/84, pulse 85, temperature 98.9 F (37.2 C), temperature source Oral, resp. rate 16, height 6\' 2"  (1.88 m), weight 97 kg (213 lb 14.4 oz), SpO2 94 %.    PHYSICAL EXAMINATION:  GENERAL:  67 y.o.-year-old patient lying in the bed with no acute distress.  EYES: Pupils equal, round, reactive to light and accommodation. No scleral icterus. Extraocular muscles intact.  HEENT: Head atraumatic, normocephalic. Oropharynx and nasopharynx clear.  NECK:  Supple, no jugular venous distention. No thyroid enlargement, no tenderness.  LUNGS:  decreased  breath sounds bilateral bases,  positive rhonchi  in the bases. No use of accessory muscles of respiration.  CARDIOVASCULAR: S1, S2 normal. No murmurs, rubs, or gallops.  ABDOMEN: Soft, non-tender, non-distended. Bowel sounds present. No organomegaly or mass.  EXTREMITIES: 2+ pedal edema, no cyanosis, or clubbing.  NEUROLOGIC: Cranial nerves II through XII are intact. Muscle strength 5/5 in all extremities. Sensation intact. Gait not checked.  PSYCHIATRIC: The patient is alert and oriented x 3.  SKIN: No obvious rash, lesion, or ulcer.   DATA REVIEW:   CBC Recent Labs  Lab 11/21/17 0548  WBC 6.2  HGB 14.0  HCT 41.2  PLT 380    Chemistries  Recent Labs  Lab 11/19/17 1519 11/21/17 0548  NA 136 139  K 4.1 4.3  CL 104 108  CO2 26 26  GLUCOSE 185* 97  BUN 15 14  CREATININE 0.90 0.81  CALCIUM 8.4* 8.4*  AST 28  --   ALT 35  --   ALKPHOS 84  --   BILITOT 0.7  --     Microbiology  Results  Results for orders placed or performed during the hospital encounter of 11/19/17  Culture, blood (Routine x 2)     Status: None (Preliminary result)   Collection Time: 11/19/17  3:19 PM  Result Value Ref Range Status   Specimen Description BLOOD BLOOD LEFT HAND  Final   Special Requests Blood Culture adequate volume  Final   Culture   Final    NO GROWTH 3 DAYS Performed at [facility], [address], [place], [place] [address]    Report Status PENDING  Incomplete  Culture, blood (Routine x 2)     Status: None (Preliminary result)   Collection Time: 11/19/17  3:24 PM  Result Value Ref Range Status   Specimen Description BLOOD RIGHT WRIST  Final   Special Requests   Final    BOTTLES DRAWN AEROBIC AND ANAEROBIC Blood Culture adequate volume   Culture   Final    NO GROWTH 3 DAYS Performed at [facility], [address], [place], [place] [address]    Report Status PENDING  Incomplete    Management plans discussed with the patient, and he is in  agreement.  CODE STATUS:     Code Status Orders  (From admission, onward)        Start     Ordered   11/19/17 2321  Full code  Continuous     11/19/17 2320    Code Status History    Date Active Date Inactive Code Status Order ID Comments User Context   07/26/2015 1008 08/01/2015 2046 Full Code [id number]  Crissie Figures, MD Inpatient    Advance Directive Documentation     Most Recent Value  Type of Advance Directive  Living will  Pre-existing out of facility DNR order (yellow form or pink MOST form)  -  "MOST" Form in Place?  -      TOTAL TIME TAKING CARE OF THIS PATIENT: 35 minutes.    Alford Highland M.D on 11/22/2017 at 12:05 PM  Between 7am to 6pm - Pager - [phone]  After 6pm go to [email or web address] - password [email or web address]  [employer] Office  [phone]  CC: Primary care physician; Carlean Jews, NP

## 2017-11-22 NOTE — Care Management (Signed)
RNCM consult for medication needs. [facility] and may be in deductible days. Lives with wife and will take him home today. He states he is independent with mobility. [facility]. He states [facility] and he feels he has enough money to pay for them. [facility] to follow up next week with patient.

## 2017-11-24 LAB — CULTURE, BLOOD (ROUTINE X 2)
CULTURE: NO GROWTH
Culture: NO GROWTH
SPECIAL REQUESTS: ADEQUATE
Special Requests: ADEQUATE

## 2017-11-25 ENCOUNTER — Other Ambulatory Visit: Payer: Self-pay | Admitting: *Deleted

## 2017-11-25 NOTE — Patient Outreach (Signed)
[facility] Care Management   11/25/2017  Shanon PayorJoseph Sheen  [birth year] [id number]  Unsuccessful telephone encounter to Shanon PayorJoseph Carley, 67 year old male- follow up on referral  Received 11/24/17 from inpatient RN CM Collie SiadAngela Johnson [facility]- pt's recent  Hospitalization April 10-13,2019 for Dyspnea on  Exertion, Acute respiratory failure with hypoxia,  Healthcare community acquired pneumonia.  Pt's History includes but not limited to Hypertension, Hyperglycemia, PSVT, Empyema.   PCP office on  List of doing transition of care calls.    HIPAA compliant voice message left with RN CM's  Contact information with request to return call.   Plan:  Unsuccessful outreach letter to be sent to  Pt and if no response in 10 business days to close  Case.  Also plan to make second call within 3 days.  Shayne Alkenose M.   Pierzchala RN CCM [facility] Care Management  [phone]

## 2017-11-27 ENCOUNTER — Ambulatory Visit: Payer: Self-pay | Admitting: *Deleted

## 2017-11-28 ENCOUNTER — Ambulatory Visit: Payer: Self-pay | Admitting: *Deleted

## 2017-11-28 ENCOUNTER — Other Ambulatory Visit: Payer: Self-pay | Admitting: *Deleted

## 2017-11-28 NOTE — Patient Outreach (Signed)
[facility] Care Management  11/28/2017  Aaron AriasJoseph W Luna 04/30/1951 [id number]   Second unsuccessful telephone encounter to Aaron [name], 67 year old male- follow up on Referral received 11/24/17 from inpatient RN CM Collie SiadAngela Johnson [facility]- pt's  Recent hospitalization April 10-13,2019 for Dyspnea on exertion,acute respiratory failure with  Hypoxia, Healthcare community acquired pneumonia. PCP office on list of doing transition of  Care calls.   HIPPA compliant voice message left with RN CM's contact information.      Plan:  If no response to voice message- to make a third attempt  in 4 business days,       Close case on 10 th business day of when unsuccessful outreach letter sent to pt.    Shayne Alkenose M.   Pierzchala RN CCM [facility] Care Management  [phone]

## 2017-12-03 ENCOUNTER — Ambulatory Visit: Payer: Self-pay | Admitting: Internal Medicine

## 2017-12-04 ENCOUNTER — Other Ambulatory Visit: Payer: Self-pay | Admitting: *Deleted

## 2017-12-04 ENCOUNTER — Ambulatory Visit: Payer: Self-pay | Admitting: *Deleted

## 2017-12-04 NOTE — Patient Outreach (Signed)
[facility]) Care Management  12/04/2017  Aaron AriasJoseph W Luna 03/01/1951 [id number]   Third unsuccessful telephone encounter to Aaron [name], 67 year old male- follow up On referral received 11/24/17 from inpatient RN CM Collie Si[facility]Angela Johnson [facility]- Pt's recent hospitalization April 10-13,2019 for Dyspnea on exertion, acute respiratory  Failure with Hypoxia, Healthcare community acquired pneumonia.   HIPAA compliant voice message left with RN CM's contact information.   Plan:  If no response, to close case next week - 10 th business day from when  Unsuccessful outreach letter sent to pt.     Shayne Alkenose M.   Pierzchala RN CCM [facility] Care Management  [phone]

## 2017-12-04 NOTE — Patient Outreach (Signed)
[facility]) Care Management  12/04/2017  Aaron AriasJoseph W Luna 11/03/1950 [id number]   Received a return phone call from pt in response to unsuccessful outreach letter sent.  Spoke  With pt, HIPAA identifiers verified, discussed purpose of letter, phone call attempts- follow up on  Referral received from inpatient RN CM,follow up on recent hospitalization (April 10-13,2019  For dyspnea on exertion, acute respiratory failure with hypoxia, [facility] community  Acquired pneumonia) Discussed with pt [facility] CM services, benefit of his insurance/PCP, no cost to him, [employer], [employer]  RN CM assist  with self management of chronic diseases to which pt gave verbal consent to services.  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  Pt reports taking all of his medications,  Completed antibiotics, medication review completed with pt.  RN CM discussed with pt  Signs/symptoms of pneumonia to which pt reports has had pneumonia in the past,knows  What to look for.  Pt reports BP has gone down pneumonia is starting to clear, BP now  128/74.   Pt reports was sent to the hospital by Dr. Welton FlakesKhan following results of CT scan, MD  Came to the hospital to see him.  Pt reports he has not scheduled follow up appointment  With PCP yet to which RN CM discussed the importance.     Plan:  As discussed with pt, plan to follow up again next week- initial home visit.           Plan to inform Heather [place] [facility], send barrier letter.   Aaron Alkenose M.   Davier Tramell RN CCM [facility]  [phone]

## 2017-12-08 ENCOUNTER — Ambulatory Visit: Payer: Medicare Other | Admitting: Internal Medicine

## 2017-12-08 ENCOUNTER — Encounter: Payer: Self-pay | Admitting: Internal Medicine

## 2017-12-08 VITALS — BP 138/86 | HR 92 | Resp 18 | Ht 74.0 in | Wt 218.2 lb

## 2017-12-08 DIAGNOSIS — J9611 Chronic respiratory failure with hypoxia: Secondary | ICD-10-CM

## 2017-12-08 DIAGNOSIS — J189 Pneumonia, unspecified organism: Secondary | ICD-10-CM

## 2017-12-08 DIAGNOSIS — R300 Dysuria: Secondary | ICD-10-CM | POA: Insufficient documentation

## 2017-12-08 DIAGNOSIS — R000 Tachycardia, unspecified: Secondary | ICD-10-CM | POA: Insufficient documentation

## 2017-12-08 DIAGNOSIS — M25549 Pain in joints of unspecified hand: Secondary | ICD-10-CM | POA: Insufficient documentation

## 2017-12-08 DIAGNOSIS — M19049 Primary osteoarthritis, unspecified hand: Secondary | ICD-10-CM | POA: Insufficient documentation

## 2017-12-08 DIAGNOSIS — J449 Chronic obstructive pulmonary disease, unspecified: Secondary | ICD-10-CM | POA: Diagnosis not present

## 2017-12-08 DIAGNOSIS — J869 Pyothorax without fistula: Secondary | ICD-10-CM | POA: Diagnosis not present

## 2017-12-08 NOTE — Patient Instructions (Signed)

## 2017-12-08 NOTE — Progress Notes (Signed)
[facility] [place], [place] [address]  Pulmonary Sleep Medicine   Office Visit Note  Patient Name: Aaron Luna DOB: [birth year] MRN [id number]  Date of Service: 12/08/2017  Complaints/HPI: Patient is seen for follow-up after discharge from the hospital.  He was admitted with the Healthcare associated pneumonia diagnosis.  Also has a history of empyema.  States that he is feeling better now however he was discharged approximately 2 weeks ago has no cough at the states at his saturations are about 92 94%.  He does have some leg swelling but is not willing to take any diuretics for that.  Right now has no chest pain no cough no congestion.  No wheezing is noted some shortness of breath on exertion is noted.  ROS  General: (-) fever, (-) chills, (-) night sweats, (-) weakness Skin: (-) rashes, (-) itching,. Eyes: (-) visual changes, (-) redness, (-) itching. Nose and Sinuses: (-) nasal stuffiness or itchiness, (-) postnasal drip, (-) nosebleeds, (-) sinus trouble. Mouth and Throat: (-) sore throat, (-) hoarseness. Neck: (-) swollen glands, (-) enlarged thyroid, (-) neck pain. Respiratory: - cough, (-) bloody sputum, + shortness of breath, - wheezing. Cardiovascular: - ankle swelling, (-) chest pain. Lymphatic: (-) lymph node enlargement. Neurologic: (-) numbness, (-) tingling. Psychiatric: (-) anxiety, (-) depression   Current Medication: Outpatient Encounter Medications as of 12/08/2017  Medication Sig Note  . albuterol (PROVENTIL HFA;VENTOLIN HFA) 108 (90 Base) MCG/ACT inhaler Inhale 2 puffs into the lungs every 4 (four) hours as needed for wheezing or shortness of breath.   Marland Kitchen amoxicillin-clavulanate (AUGMENTIN) 875-125 MG tablet Take 1 tablet by mouth every 12 (twelve) hours. (Patient not taking: Reported on 12/04/2017) 12/04/2017: Completed   . cloNIDine (CATAPRES) 0.1 MG tablet Take 1 tablet (0.1 mg total) by mouth 2 (two) times daily.   Marland Kitchen docusate  sodium (COLACE) 100 MG capsule Take 100 mg by mouth 2 (two) times daily. 12/04/2017: Pt taking 2 capsules once a day   . doxycycline (VIBRA-TABS) 100 MG tablet Take 1 tablet (100 mg total) by mouth every 12 (twelve) hours. (Patient not taking: Reported on 12/04/2017) 12/04/2017: Completed   . magnesium gluconate (MAGONATE) 500 MG tablet Take 500 mg by mouth 2 (two) times daily.   . Magnesium Oxide 250 MG TABS Take by mouth. Pt taking 2 tablets once a day   . naproxen sodium (ALEVE) 220 MG tablet Take 220 mg by mouth. As needed.   . Tiotropium Bromide-Olodaterol (STIOLTO RESPIMAT) 2.5-2.5 MCG/ACT AERS Inhale 1 puff into the lungs daily.    No facility-administered encounter medications on file as of 12/08/2017.     Surgical History: Past Surgical History:  Procedure Laterality Date  . CHOLECYSTECTOMY    . TONSILLECTOMY      Medical History: Past Medical History:  Diagnosis Date  . Empyema ([place]) 11/20/2017  . Essential hypertension   . Hyperglycemia   . Obstructive chronic bronchitis without exacerbation ([place]) 11/20/2017  . PSVT (paroxysmal supraventricular tachycardia) (H[facility])    a. 10/2012-->coverted with adenosine in ED (6-->58m);  b 10/2012 Neg MV, EF 59%; c. 10/2012 Echo: EF 55%, nl valves.  . Tobacco abuse     Family History: Family History  Problem Relation Age of Onset  . Heart disease Father 579      CABG x 4  . Hypertension Sister     Social History: Social History   Socioeconomic History  . Marital status: Married    Spouse name: Not on  file  . Number of children: Not on file  . Years of education: Not on file  . Highest education level: Not on file  Occupational History  . Not on file  Social Needs  . Financial resource strain: Not on file  . Food insecurity:    Worry: Not on file    Inability: Not on file  . Transportation needs:    Medical: Not on file    Non-medical: Not on file  Tobacco Use  . Smoking status: Former Smoker    Types: Cigarettes  .  Smokeless tobacco: Former Systems developer    Quit date: 05/12/2015  Substance and Sexual Activity  . Alcohol use: Not Currently    Frequency: Never    Comment: ocassionally  . Drug use: No  . Sexual activity: Not on file  Lifestyle  . Physical activity:    Days per week: Not on file    Minutes per session: Not on file  . Stress: Not on file  Relationships  . Social connections:    Talks on phone: Not on file    Gets together: Not on file    Attends religious service: Not on file    Active member of club or organization: Not on file    Attends meetings of clubs or organizations: Not on file    Relationship status: Not on file  . Intimate partner violence:    Fear of current or ex partner: Not on file    Emotionally abused: Not on file    Physically abused: Not on file    Forced sexual activity: Not on file  Other Topics Concern  . Not on file  Social History Narrative  . Not on file    Vital Signs: Blood pressure 138/86, pulse 92, resp. rate 18, height '6\' 2"'  (1.88 m), weight 218 lb 3.2 oz (99 kg), SpO2 93 %.  Examination: General Appearance: The patient is well-developed, well-nourished, and in no distress. Skin: Gross inspection of skin unremarkable. Head: normocephalic, no gross deformities. Eyes: no gross deformities noted. ENT: ears appear grossly normal no exudates. Neck: Supple. No thyromegaly. No LAD. Respiratory: scattered rhonchi. Cardiovascular: Normal S1 and S2 without murmur or rub. Extremities: No cyanosis. pulses are equal. Neurologic: Alert and oriented. No involuntary movements.  LABS: Recent Results (from the past 2160 hour(s))  Urinalysis, Routine w reflex microscopic     Status: None   Collection Time: 10/03/17 10:43 AM  Result Value Ref Range   Specific Gravity, UA 1.017 1.005 - 1.030   pH, UA 5.0 5.0 - 7.5   Color, UA Yellow Yellow   Appearance Ur Clear Clear   Leukocytes, UA Negative Negative   Protein, UA Negative Negative/Trace   Glucose, UA  Negative Negative   Ketones, UA Negative Negative   RBC, UA Negative Negative   Bilirubin, UA Negative Negative   Urobilinogen, Ur 0.2 0.2 - 1.0 mg/dL   Nitrite, UA Negative Negative   Microscopic Examination Comment     Comment: Microscopic not indicated and not performed.  I-STAT creatinine     Status: None   Collection Time: 11/19/17 12:29 PM  Result Value Ref Range   Creatinine, Ser 0.80 0.61 - 1.24 mg/dL  Comprehensive metabolic panel     Status: Abnormal   Collection Time: 11/19/17  3:19 PM  Result Value Ref Range   Sodium 136 135 - 145 mmol/L   Potassium 4.1 3.5 - 5.1 mmol/L   Chloride 104 101 - 111 mmol/L   CO2 26  22 - 32 mmol/L   Glucose, Bld 185 (H) 65 - 99 mg/dL   BUN 15 6 - 20 mg/dL   Creatinine, Ser 0.90 0.61 - 1.24 mg/dL   Calcium 8.4 (L) 8.9 - 10.3 mg/dL   Total Protein 6.7 6.5 - 8.1 g/dL   Albumin 3.4 (L) 3.5 - 5.0 g/dL   AST 28 15 - 41 U/L   ALT 35 17 - 63 U/L   Alkaline Phosphatase 84 38 - 126 U/L   Total Bilirubin 0.7 0.3 - 1.2 mg/dL   GFR calc non Af Amer >60 >60 mL/min   GFR calc Af Amer >60 >60 mL/min    Comment: (NOTE) The eGFR has been calculated using the CKD EPI equation. This calculation has not been validated in all clinical situations. eGFR's persistently <60 mL/min signify possible Chronic Kidney Disease.    Anion gap 6 5 - 15    Comment: Performed at [facility], [address] [place], [place] [address]  Lactic acid, plasma     Status: None   Collection Time: 11/19/17  3:19 PM  Result Value Ref Range   Lactic Acid, Venous 1.8 0.5 - 1.9 mmol/L    Comment: Performed at [facility], [address] [place], [place] [address]  CBC with Differential     Status: None   Collection Time: 11/19/17  3:19 PM  Result Value Ref Range   WBC 6.4 3.8 - 10.6 K/uL   RBC 4.93 4.40 - 5.90 MIL/uL   Hemoglobin 14.6 13.0 - 18.0 g/dL   HCT 42.8 40.0 - 52.0 %   MCV 86.8 80.0 - 100.0 fL   MCH 29.7 26.0 - 34.0 pg   MCHC 34.2 32.0 - 36.0  g/dL   RDW 13.9 11.5 - 14.5 %   Platelets 417 150 - 440 K/uL   Neutrophils Relative % 76 %   Neutro Abs 4.8 1.4 - 6.5 K/uL   Lymphocytes Relative 16 %   Lymphs Abs 1.0 1.0 - 3.6 K/uL   Monocytes Relative 6 %   Monocytes Absolute 0.4 0.2 - 1.0 K/uL   Eosinophils Relative 1 %   Eosinophils Absolute 0.1 0 - 0.7 K/uL   Basophils Relative 1 %   Basophils Absolute 0.0 0 - 0.1 K/uL    Comment: Performed at [facility], [address] [place], [place] [address]  Protime-INR     Status: None   Collection Time: 11/19/17  3:19 PM  Result Value Ref Range   Prothrombin Time 12.9 11.4 - 15.2 seconds   INR 0.98     Comment: Performed at [facility], [address] [place], [place] [address]  Culture, blood (Routine x 2)     Status: None   Collection Time: 11/19/17  3:19 PM  Result Value Ref Range   Specimen Description BLOOD BLOOD LEFT HAND    Special Requests Blood Culture adequate volume    Culture      NO GROWTH 5 DAYS Performed at [facility], [address] [place], [place] [address]    Report Status 11/24/2017 FINAL   Urinalysis, Complete w Microscopic     Status: Abnormal   Collection Time: 11/19/17  3:19 PM  Result Value Ref Range   Color, Urine YELLOW (A) YELLOW   APPearance HAZY (A) CLEAR   Specific Gravity, Urine 1.036 (H) 1.005 - 1.030   pH 7.0 5.0 - 8.0   Glucose, UA NEGATIVE NEGATIVE mg/dL   Hgb urine dipstick NEGATIVE  NEGATIVE   Bilirubin Urine NEGATIVE NEGATIVE   Ketones, ur NEGATIVE NEGATIVE mg/dL   Protein, ur NEGATIVE NEGATIVE mg/dL   Nitrite NEGATIVE NEGATIVE   Leukocytes, UA NEGATIVE NEGATIVE   RBC / HPF 0-5 0 - 5 RBC/hpf   WBC, UA 0-5 0 - 5 WBC/hpf   Bacteria, UA NONE SEEN NONE SEEN   Squamous Epithelial / LPF NONE SEEN NONE SEEN    Comment: Performed at [facility], [address] [place], [place] [address]  Culture, blood (Routine x 2)     Status: None   Collection Time: 11/19/17  3:24 PM  Result Value Ref  Range   Specimen Description BLOOD RIGHT WRIST    Special Requests      BOTTLES DRAWN AEROBIC AND ANAEROBIC Blood Culture adequate volume   Culture      NO GROWTH 5 DAYS Performed at [facility], [address] [place], [place] [address]    Report Status 11/24/2017 FINAL   Procalcitonin - Baseline     Status: None   Collection Time: 11/19/17  5:52 PM  Result Value Ref Range   Procalcitonin <0.10 ng/mL    Comment:        Interpretation: PCT (Procalcitonin) <= 0.5 ng/mL: Systemic infection (sepsis) is not likely. Local bacterial infection is possible. (NOTE)       Sepsis PCT Algorithm           Lower Respiratory Tract                                      Infection PCT Algorithm    ----------------------------     ----------------------------         PCT < 0.25 ng/mL                PCT < 0.10 ng/mL         Strongly encourage             Strongly discourage   discontinuation of antibiotics    initiation of antibiotics    ----------------------------     -----------------------------       PCT 0.25 - 0.50 ng/mL            PCT 0.10 - 0.25 ng/mL               OR       >80% decrease in PCT            Discourage initiation of                                            antibiotics      Encourage discontinuation           of antibiotics    ----------------------------     -----------------------------         PCT >= 0.50 ng/mL              PCT 0.26 - 0.50 ng/mL               AND        <80% decrease in PCT             Encourage initiation of  antibiotics       Encourage continuation           of antibiotics    ----------------------------     -----------------------------        PCT >= 0.50 ng/mL                  PCT > 0.50 ng/mL               AND         increase in PCT                  Strongly encourage                                      initiation of antibiotics    Strongly encourage escalation           of antibiotics                                      -----------------------------                                           PCT <= 0.25 ng/mL                                                 OR                                        > 80% decrease in PCT                                     Discontinue / Do not initiate                                             antibiotics Performed at [facility], [address] [place], [place] [address]   Strep pneumoniae urinary antigen     Status: None   Collection Time: 11/19/17  5:53 PM  Result Value Ref Range   Strep Pneumo Urinary Antigen NEGATIVE NEGATIVE    Comment:        Infection due to S. pneumoniae cannot be absolutely ruled out since the antigen present may be below the detection limit of the test. Performed at [facility], [place] [address], [place], [place] [address]   Legionella Pneumophila Serogp 1 Ur Ag     Status: None   Collection Time: 11/19/17  5:53 PM  Result Value Ref Range   L. pneumophila Serogp 1 Ur Ag Negative Negative    Comment: (NOTE) Presumptive negative for L. pneumophila serogroup 1 antigen in urine, suggesting no recent or current infection. Legionnaires' disease cannot be ruled out since other serogroups and species may also cause disease. Performed At: [facility] [address]  [address] [place], [place] [id number] Rush Farmer MD [id number]    Source of Sample URINE, RANDOM     Comment: Performed at [facility], [address] [place], [place] [address]  Blood gas, arterial     Status: Abnormal   Collection Time: 11/19/17 11:50 PM  Result Value Ref Range   FIO2 0.28    Delivery systems NASAL CANNULA    pH, Arterial 7.43 7.350 - 7.450   pCO2 arterial 38 32.0 - 48.0 mmHg   pO2, Arterial 61 (L) 83.0 - 108.0 mmHg   Bicarbonate 25.2 20.0 - 28.0 mmol/L   Acid-Base Excess 1.0 0.0 - 2.0 mmol/L   O2 Saturation 91.7 %   Patient temperature 37.0    Collection site LEFT RADIAL    Sample  type ARTERIAL DRAW    Allens test (pass/fail) PASS PASS    Comment: Performed at [facility], [address] [place], [place] [address]  Procalcitonin     Status: None   Collection Time: 11/20/17  4:21 AM  Result Value Ref Range   Procalcitonin <0.10 ng/mL    Comment:        Interpretation: PCT (Procalcitonin) <= 0.5 ng/mL: Systemic infection (sepsis) is not likely. Local bacterial infection is possible. (NOTE)       Sepsis PCT Algorithm           Lower Respiratory Tract                                      Infection PCT Algorithm    ----------------------------     ----------------------------         PCT < 0.25 ng/mL                PCT < 0.10 ng/mL         Strongly encourage             Strongly discourage   discontinuation of antibiotics    initiation of antibiotics    ----------------------------     -----------------------------       PCT 0.25 - 0.50 ng/mL            PCT 0.10 - 0.25 ng/mL               OR       >80% decrease in PCT            Discourage initiation of                                            antibiotics      Encourage discontinuation           of antibiotics    ----------------------------     -----------------------------         PCT >= 0.50 ng/mL              PCT 0.26 - 0.50 ng/mL               AND        <80% decrease in PCT             Encourage initiation of  antibiotics       Encourage continuation           of antibiotics    ----------------------------     -----------------------------        PCT >= 0.50 ng/mL                  PCT > 0.50 ng/mL               AND         increase in PCT                  Strongly encourage                                      initiation of antibiotics    Strongly encourage escalation           of antibiotics                                     -----------------------------                                           PCT <= 0.25 ng/mL                                                  OR                                        > 80% decrease in PCT                                     Discontinue / Do not initiate                                             antibiotics Performed at [facility], [address] [place], [place] [address]   HIV antibody (Routine Screening)     Status: None   Collection Time: 11/20/17  4:21 AM  Result Value Ref Range   HIV Screen 4th Generation wRfx Non Reactive Non Reactive    Comment: (NOTE) Performed At: [facility] [place], [place] [id number] Rush Farmer MD [id number] Performed at [facility], [address] [place],  [address]   Procalcitonin     Status: None   Collection Time: 11/21/17  5:48 AM  Result Value Ref Range   Procalcitonin <0.10 ng/mL    Comment:        Interpretation: PCT (Procalcitonin) <= 0.5 ng/mL: Systemic infection (sepsis) is not likely. Local bacterial infection is possible. (NOTE)       Sepsis PCT Algorithm           Lower Respiratory Tract  Infection PCT Algorithm    ----------------------------     ----------------------------         PCT < 0.25 ng/mL                PCT < 0.10 ng/mL         Strongly encourage             Strongly discourage   discontinuation of antibiotics    initiation of antibiotics    ----------------------------     -----------------------------       PCT 0.25 - 0.50 ng/mL            PCT 0.10 - 0.25 ng/mL               OR       >80% decrease in PCT            Discourage initiation of                                            antibiotics      Encourage discontinuation           of antibiotics    ----------------------------     -----------------------------         PCT >= 0.50 ng/mL              PCT 0.26 - 0.50 ng/mL               AND        <80% decrease in PCT             Encourage initiation of                                             antibiotics        Encourage continuation           of antibiotics    ----------------------------     -----------------------------        PCT >= 0.50 ng/mL                  PCT > 0.50 ng/mL               AND         increase in PCT                  Strongly encourage                                      initiation of antibiotics    Strongly encourage escalation           of antibiotics                                     -----------------------------                                           PCT <= 0.25 ng/mL  OR                                        > 80% decrease in PCT                                     Discontinue / Do not initiate                                             antibiotics Performed at [facility], [address] [place], [place] [address]   Basic metabolic panel     Status: Abnormal   Collection Time: 11/21/17  5:48 AM  Result Value Ref Range   Sodium 139 135 - 145 mmol/L   Potassium 4.3 3.5 - 5.1 mmol/L   Chloride 108 101 - 111 mmol/L   CO2 26 22 - 32 mmol/L   Glucose, Bld 97 65 - 99 mg/dL   BUN 14 6 - 20 mg/dL   Creatinine, Ser 0.81 0.61 - 1.24 mg/dL   Calcium 8.4 (L) 8.9 - 10.3 mg/dL   GFR calc non Af Amer >60 >60 mL/min   GFR calc Af Amer >60 >60 mL/min    Comment: (NOTE) The eGFR has been calculated using the CKD EPI equation. This calculation has not been validated in all clinical situations. eGFR's persistently <60 mL/min signify possible Chronic Kidney Disease.    Anion gap 5 5 - 15    Comment: Performed at [facility], [address] [place], [place] [address]  CBC     Status: None   Collection Time: 11/21/17  5:48 AM  Result Value Ref Range   WBC 6.2 3.8 - 10.6 K/uL   RBC 4.68 4.40 - 5.90 MIL/uL   Hemoglobin 14.0 13.0 - 18.0 g/dL   HCT 41.2 40.0 - 52.0 %   MCV 87.9 80.0 - 100.0 fL   MCH 29.9 26.0 - 34.0 pg   MCHC 34.1 32.0 - 36.0 g/dL   RDW 14.0 11.5 - 14.5 %   Platelets 380 150 -  440 K/uL    Comment: Performed at [facility], [address] [place], [place] [address]    Radiology: Ct Angio Chest Pe W Or Wo Contrast  Result Date: 11/19/2017 CLINICAL DATA:  Shortness of breath 2 years. Symptoms have worsened over the past 3 weeks. EXAM: CT ANGIOGRAPHY CHEST WITH CONTRAST TECHNIQUE: Multidetector CT imaging of the chest was performed using the standard protocol during bolus administration of intravenous contrast. Multiplanar CT image reconstructions and MIPs were obtained to evaluate the vascular anatomy. CONTRAST:  75 ml OMNIPAQUE IOHEXOL 350 MG/ML SOLN COMPARISON:  PA and lateral chest 09/15/2017 and 10/16/2015. CT chest 11/28/2015. FINDINGS: Cardiovascular: Satisfactory opacification of the pulmonary arteries to the segmental level. No evidence of pulmonary embolism. Normal heart size. No pericardial effusion. Scattered aortic atherosclerotic calcifications are identified. Mediastinum/Nodes: No enlarged mediastinal, hilar, or axillary lymph nodes. Thyroid gland, trachea, and esophagus demonstrate no significant findings. Lungs/Pleura: No pleural effusion. The lungs demonstrate emphysematous disease. There is new airspace opacity in the periphery of the right lung apex. A small focus of airspace opacity in the right upper lobe is contiguous with the major fissure. There  is also new airspace opacity in the left lung base superimposed on chronic scar. Upper Abdomen: Negative. No acute bony abnormality. Plate and screw fixation of the left seventh rib noted. Musculoskeletal: Negative for pulmonary embolus. Review of the MIP images confirms the above findings. IMPRESSION: Negative for pulmonary embolus. Airspace disease in the right upper lobe and left lower lobe most consistent with multifocal pneumonia. No change in left basilar scar. Aortic Atherosclerosis (ICD10-I70.0) and Emphysema (ICD10-J43.9). Electronically Signed   By: Inge Rise M.D.   On: 11/19/2017 12:58     No results found.  Ct Angio Chest Pe W Or Wo Contrast  Result Date: 11/19/2017 CLINICAL DATA:  Shortness of breath 2 years. Symptoms have worsened over the past 3 weeks. EXAM: CT ANGIOGRAPHY CHEST WITH CONTRAST TECHNIQUE: Multidetector CT imaging of the chest was performed using the standard protocol during bolus administration of intravenous contrast. Multiplanar CT image reconstructions and MIPs were obtained to evaluate the vascular anatomy. CONTRAST:  75 ml OMNIPAQUE IOHEXOL 350 MG/ML SOLN COMPARISON:  PA and lateral chest 09/15/2017 and 10/16/2015. CT chest 11/28/2015. FINDINGS: Cardiovascular: Satisfactory opacification of the pulmonary arteries to the segmental level. No evidence of pulmonary embolism. Normal heart size. No pericardial effusion. Scattered aortic atherosclerotic calcifications are identified. Mediastinum/Nodes: No enlarged mediastinal, hilar, or axillary lymph nodes. Thyroid gland, trachea, and esophagus demonstrate no significant findings. Lungs/Pleura: No pleural effusion. The lungs demonstrate emphysematous disease. There is new airspace opacity in the periphery of the right lung apex. A small focus of airspace opacity in the right upper lobe is contiguous with the major fissure. There is also new airspace opacity in the left lung base superimposed on chronic scar. Upper Abdomen: Negative. No acute bony abnormality. Plate and screw fixation of the left seventh rib noted. Musculoskeletal: Negative for pulmonary embolus. Review of the MIP images confirms the above findings. IMPRESSION: Negative for pulmonary embolus. Airspace disease in the right upper lobe and left lower lobe most consistent with multifocal pneumonia. No change in left basilar scar. Aortic Atherosclerosis (ICD10-I70.0) and Emphysema (ICD10-J43.9). Electronically Signed   By: Inge Rise M.D.   On: 11/19/2017 12:58      Assessment and Plan: Patient Active Problem List   Diagnosis Date Noted  . Dysuria  12/08/2017  . Empyema lung ([place]) 12/08/2017  . Hand joint pain 12/08/2017  . Osteoarthrosis, hand 12/08/2017  . Tachycardia 12/08/2017  . Obstructive chronic bronchitis without exacerbation ([place]) 11/20/2017  . Empyema ([place]) 11/20/2017  . HCAP (healthcare-associated pneumonia) 11/19/2017  . COPD (chronic obstructive pulmonary disease) ([place]) 09/01/2017  . Shortness of breath 08/28/2017  . Loculated pleural effusion 09/19/2015  . Pleurodynia 09/19/2015  . Pleural effusion 08/01/2015  . Acute hypoxemic respiratory failure ([place]) 07/26/2015  . Pneumonia, community acquired 07/26/2015  . HTN (hypertension), benign 07/26/2015  . PSVT (paroxysmal supraventricular tachycardia) ([place]) 11/04/2012  . Essential hypertension 11/04/2012  . Tobacco abuse 11/04/2012  . Edema 11/04/2012    1. Chronic respiratory failure  Patient is going to be continued on current medications on also was recommended that he check a overnight oxygen and T does not want to do that at this time. 2. COPD  Stable now will need follow-up breathing test to be done. 3. HCAP  Treated with antibiotics now clinically resolved will continue to follow 4. Empyema by history follow-up x-ray will be done in about 3 months time  General Counseling: I have discussed the findings of the evaluation and examination with Aaron Luna.  I have also discussed any  further diagnostic evaluation thatmay be needed or ordered today. Aaron Luna verbalizes understanding of the findings of todays visit. We also reviewed his medications today and discussed drug interactions and side effects including but not limited excessive drowsiness and altered mental states. We also discussed that there is always a risk not just to him but also people around him. he has been encouraged to call the office with any questions or concerns that should arise related to todays visit.    Time spent: 64mn  I have personally obtained a history, examined the patient, evaluated  laboratory and imaging results, formulated the assessment and plan and placed orders.    SAllyne Gee MD [facility]

## 2017-12-11 ENCOUNTER — Other Ambulatory Visit: Payer: Self-pay | Admitting: *Deleted

## 2017-12-11 ENCOUNTER — Encounter: Payer: Self-pay | Admitting: *Deleted

## 2017-12-11 NOTE — Patient Outreach (Addendum)
[facility]) Care Management   12/11/2017  Aaron Luna [birth year] [id number]  Aaron Luna is an 67 y.o. male  Subjective:  Pt reports saw Lung MD this week, chest was clear, to follow up again in  3 months.  Pt reports sob with exertion, cannot do what he used to do, rests as  Needed.    Pt reports to have CT scan in 3-4 weeks to assess lungs.  Pt reports he  Sleeps in chair sitting up.   Pt reports stopped taking his BP medication 2 weeks ago,  Did not feel it was helping, MD aware, been checking BP/recording.   Objective:   Vitals:   12/11/17 1119 12/11/17 1226  BP: 128/78 120/78  Pulse: 84   Resp:  (!) 32  SpO2: 98%     ROS  Physical Exam  Constitutional: He is oriented to person, place, and time. He appears well-developed and well-nourished.  Cardiovascular: Normal rate, regular rhythm and normal heart sounds.  Respiratory: Effort normal and breath sounds normal.  GI: Soft. Bowel sounds are normal.  Musculoskeletal: Normal range of motion. He exhibits edema.  + 1 edema bilateral lower extremities, ankles, top of feet   Neurological: He is alert and oriented to person, place, and time.  Skin: Skin is warm and dry.  Psychiatric: He has a normal mood and affect. His behavior is normal. Judgment and thought content normal.    Encounter Medications:   Outpatient Encounter Medications as of 12/11/2017  Medication Sig Note  . albuterol (PROVENTIL HFA;VENTOLIN HFA) 108 (90 Base) MCG/ACT inhaler Inhale 2 puffs into the lungs every 4 (four) hours as needed for wheezing or shortness of breath.   Marland Kitchen amoxicillin-clavulanate (AUGMENTIN) 875-125 MG tablet Take 1 tablet by mouth every 12 (twelve) hours. (Patient not taking: Reported on 12/04/2017) 12/04/2017: Completed   . cloNIDine (CATAPRES) 0.1 MG tablet Take 1 tablet (0.1 mg total) by mouth 2 (two) times daily.   Marland Kitchen docusate sodium (COLACE) 100 MG capsule Take 100 mg by mouth 2 (two) times daily. 12/04/2017: Pt  taking 2 capsules once a day   . doxycycline (VIBRA-TABS) 100 MG tablet Take 1 tablet (100 mg total) by mouth every 12 (twelve) hours. (Patient not taking: Reported on 12/04/2017) 12/04/2017: Completed   . magnesium gluconate (MAGONATE) 500 MG tablet Take 500 mg by mouth 2 (two) times daily.   . Magnesium Oxide 250 MG TABS Take by mouth. Pt taking 2 tablets once a day   . naproxen sodium (ALEVE) 220 MG tablet Take 220 mg by mouth. As needed.   . Tiotropium Bromide-Olodaterol (STIOLTO RESPIMAT) 2.5-2.5 MCG/ACT AERS Inhale 1 puff into the lungs daily.    No facility-administered encounter medications on file as of 12/11/2017.     Functional Status:   In your present state of health, do you have any difficulty performing the following activities: 11/20/2017 11/19/2017  Hearing? N N  Vision? N N  Difficulty concentrating or making decisions? N N  Walking or climbing stairs? N N  Dressing or bathing? N N  Doing errands, shopping? N -  Preparing Food and eating ? - -  Using the Toilet? - -  In the past six months, have you accidently leaked urine? - -  Do you have problems with loss of bowel control? - -  Managing your Medications? - -  Managing your Finances? - -  Housekeeping or managing your Housekeeping? - -  Some recent data might be hidden  Fall/Depression Screening:    Fall Risk  10/03/2017 09/01/2017  Falls in the past year? No No   PHQ 2/9 Scores 10/03/2017 09/01/2017  PHQ - 2 Score 0 0    Assessment:  Pleasant 67 year old male, resides with spouse.  Recent hospitalization  11/19/17-11/22/17 for Acute hypoxemic respiratory failure, HCAP, Obstructive chronic  Bronchitis without exacerbation, Empyema.  Pt's history includes PSVT, Essential  Hypertension, Pleural effusion, COPD, Tachycardia.    COPD/recent pneumonia:  Lungs clear, sob noted after short episode of exercises.    Discussed with pt use of purse lip breathing when sob, ongoing use of rescue   inhaler as  needed. Medications: review of medications, pt not been taking Clonidine for past 2 weeks,    BP today 128/78 RA, 120/78 LA with nurse's BP cuff.  Discrepancy noted in pt's BP     Machine- running higher than RN CM's BP cuff.  As discussed, pt to purchase     New BP machine, monitor/record/bring results  to next MD visit.   Plan:  As discussed with pt, plan to follow up again in 2 weeks telephonically.           Plan to send pt's PCP Aaron Luna 12/11/17 initial home visit                Encounter.    [facility] CM Care Plan Problem One     Most Recent Value  Care Plan Problem One  Hx of COPD- recent hospitalization for acute respiratory failure, HCAP  Role Documenting the Problem One  Care Management Coordinator  Care Plan for Problem One  Active  [facility] Long Term Goal   Pt would have no issues recovering from pneumonia in the next  32 days   [facility] Long Term Goal Start Date  12/04/17  Interventions for Problem One Long Term Goal  initial home visit/assessment done, information on COPD provided   [facility] CM Short Term Goal #1   Pt would take all medications as ordered for the next 30 days   [facility] CM Short Term Goal #1 Start Date  12/04/17  Interventions for Short Term Goal #1  Medications reviewed with pt, med profile updated   [facility] CM Short Term Goal #2   Pt would schedule PCP f/u appointment in the next 7 days   [facility] CM Short Term Goal #2 Start Date  12/04/17     [name].   Pierzchala RN CCM [facility] Care Management  [phone]

## 2017-12-24 ENCOUNTER — Other Ambulatory Visit: Payer: Self-pay | Admitting: *Deleted

## 2017-12-24 NOTE — Patient Outreach (Signed)
[facility]) Care Management  12/24/2017  Aaron Luna 03/07/1951 [id number]  4:59 pm- Received a return phone call from pt to voice message left earlier, HIPAA identifiers verified by pt. Pt reports doing fine, looking like they got the infection out of me (pt's recent hospitalization April 10-13,2019  For dyspnea on exertion, acute respiratory failure with hypoxia, [facility] community acquired pneumonia). Pt reports eating good, still have sob with exertion, current O 2 sat 94-95 % (taken during phone call). Pt reports muscles across back (from coughing) getting better, doing exercises.   Pt reports still not taking  BP medication Clonidine, BP today was 127/77, BP will spike 140/150 once in awhile, goes  back down, have  Been watching, taking all of his other medications.  Pt reports to see Lung MD 3 months from last visit (view  In EMR saw MD 12/08/17).   RN CM discussed with pt plan to transfer to new RN CM next month, follow up  Telephonically- check on clinical status.     Plan:   As discussed, RN CM to transfer pt to new RN CM- follow up next month telephonically.    Addendum:  RN CM to follow up again with pt later this month telephonically - address  Care plan.   [name].   Pierzchala RN CCM [facility] Care Management  [phone]

## 2017-12-24 NOTE — Patient Outreach (Signed)
[facility]) Care Management  12/24/2017  Aaron Luna [birth year] [id number]  Unsuccessful telephone encounter to [name], 67 year old male - follow up on current clinical  Status.  Pt's most recent hospitalization April 10-13,2019 for dyspnea on exertion, acute respiratory  Failure with hypoxia, [facility] Acquired Pneumonia.  HIPAA compliant voice message  Left with RN CM's contact information with request to return call.  Plan:  If no response to voice message left, plan to follow up again in 3 days telephonically.   [name].   Pierzchala RN CCM [facility] Care Management  [phone]

## 2017-12-25 ENCOUNTER — Ambulatory Visit: Payer: Self-pay | Admitting: *Deleted

## 2018-01-06 ENCOUNTER — Other Ambulatory Visit: Payer: Self-pay | Admitting: *Deleted

## 2018-01-06 NOTE — Patient Outreach (Signed)
[facility]) Care Management  01/06/2018  Aaron Luna 1950/12/11 [id number]  Successful telephone encounter to Aaron Luna, 67 year old male check on current clinical status/update care Plan.   Spoke with pt, HIPAA identifiers verified.   Pt reports doing better, no signs/symptoms of pneumonia, hospital  Got it out of him (last in patient stay April 10-13,2019 for HCAP ).   Pt reports yesterday O 2 sat 96-98 %.  Pt reports  Taking all of his medications,went back  on Clonidine as BP (systolic)was 145 to 150 although would go down.   Pt  Reports to follow up with PCP in August.   RN CM discussed with pt again plan to transfer to another RN CM for  Follow up call next month to which pt did not feel needed follow up, knows to call if help is needed.   RN CM  Confirmed with pt has  [facility]'s main office number to call if case management issues arise in the future  as 24 hour Nurse line.      Plan:  As discussed with pt, to close case.           Plan to inform pt's PCP of discharge, send case closure letter.   Aaron Luna.   Aaron Sayler RN CCM [facility] Care Management  [phone]

## 2018-03-09 ENCOUNTER — Ambulatory Visit: Payer: Self-pay | Admitting: Internal Medicine

## 2018-04-09 ENCOUNTER — Ambulatory Visit: Payer: Medicare Other | Admitting: Nurse Practitioner

## 2018-04-09 ENCOUNTER — Encounter: Payer: Self-pay | Admitting: Nurse Practitioner

## 2018-04-09 VITALS — BP 135/76 | HR 77 | Resp 16 | Ht 74.0 in | Wt 216.0 lb

## 2018-04-09 DIAGNOSIS — I10 Essential (primary) hypertension: Secondary | ICD-10-CM

## 2018-04-09 DIAGNOSIS — R0602 Shortness of breath: Secondary | ICD-10-CM | POA: Diagnosis not present

## 2018-04-09 DIAGNOSIS — J449 Chronic obstructive pulmonary disease, unspecified: Secondary | ICD-10-CM | POA: Diagnosis not present

## 2018-04-09 NOTE — Progress Notes (Signed)
[facility] [address] [place], [place] [address]  Internal MEDICINE  Office Visit Note  Patient Name: Aaron Luna  [id number]  [id number]  Date of Service: 04/16/2018  Chief Complaint  Patient presents with  . Hypertension    6 month follow up    The patient was recently hospitalized for pneumonia. Had several days IV antibiotics. Shortness of breath and blood pressure have both improved since this last hospitalization.  Has had two episodes with his eyes. Describes a "ghost" in front of his vision. Has seen ey doctor. Was told blood vessels from the back of the eyes are breaking away. First episode began to resolve, then second episode developed last week. Happened while he was weed whacking outside.   Hypertension  This is a chronic problem. The current episode started more than 1 year ago. The problem has been gradually improving since onset. The problem is controlled. Associated symptoms include chest pain, peripheral edema and shortness of breath. Pertinent negatives include no headaches, neck pain or palpitations. There are no associated agents to hypertension. Risk factors for coronary artery disease include male gender and stress. Past treatments include central alpha agonists. The current treatment provides moderate improvement. Compliance problems include exercise.        Current Medication: Outpatient Encounter Medications as of 04/09/2018  Medication Sig Note  . albuterol (PROVENTIL HFA;VENTOLIN HFA) 108 (90 Base) MCG/ACT inhaler Inhale 2 puffs into the lungs every 4 (four) hours as needed for wheezing or shortness of breath.   . cloNIDine (CATAPRES) 0.1 MG tablet Take 1 tablet (0.1 mg total) by mouth 2 (two) times daily.   . naproxen sodium (ALEVE) 220 MG tablet Take 220 mg by mouth. As needed.   . Tiotropium Bromide-Olodaterol (STIOLTO RESPIMAT) 2.5-2.5 MCG/ACT AERS Inhale 1 puff into the lungs daily.   Marland Kitchen amoxicillin-clavulanate (AUGMENTIN) 875-125 MG  tablet Take 1 tablet by mouth every 12 (twelve) hours. (Patient not taking: Reported on 12/04/2017) 12/04/2017: Completed   . docusate sodium (COLACE) 100 MG capsule Take 100 mg by mouth 2 (two) times daily. 12/04/2017: Pt taking 2 capsules once a day   . doxycycline (VIBRA-TABS) 100 MG tablet Take 1 tablet (100 mg total) by mouth every 12 (twelve) hours. (Patient not taking: Reported on 12/04/2017) 12/04/2017: Completed   . magnesium gluconate (MAGONATE) 500 MG tablet Take 500 mg by mouth 2 (two) times daily. 12/11/2017: Pt taking the 400 mg capsule   . Magnesium Oxide 250 MG TABS Take by mouth. Pt taking 2 tablets once a day    No facility-administered encounter medications on file as of 04/09/2018.     Surgical History: Past Surgical History:  Procedure Laterality Date  . CHOLECYSTECTOMY    . TONSILLECTOMY      Medical History: Past Medical History:  Diagnosis Date  . Empyema (HCC) 11/20/2017  . Essential hypertension   . Hyperglycemia   . Obstructive chronic bronchitis without exacerbation (HCC) 11/20/2017  . PSVT (paroxysmal supraventricular tachycardia) (HCC)    a. 10/2012-->coverted with adenosine in ED (6-->12mg );  b 10/2012 Neg MV, EF 59%; c. 10/2012 Echo: EF 55%, nl valves.  . Tobacco abuse     Family History: Family History  Problem Relation Age of Onset  . Alzheimer's disease Mother   . Heart disease Father 36       CABG x 4  . Hypertension Sister     Social History   Socioeconomic History  . Marital status: Married    Spouse name: Seward Speck   .  Number of children: 2  . Years of education: Not on file  . Highest education level: Some college, no degree  Occupational History  . Occupation: retired   Engineer, productionocial Needs  . Financial resource strain: Not hard at all  . Food insecurity:    Worry: Never true    Inability: Never true  . Transportation needs:    Medical: No    Non-medical: No  Tobacco Use  . Smoking status: Former Smoker    Types: Cigarettes  . Smokeless  tobacco: Never Used  Substance and Sexual Activity  . Alcohol use: Not Currently    Frequency: Never    Comment: ocassionally  . Drug use: No  . Sexual activity: Not on file  Lifestyle  . Physical activity:    Days per week: 7 days    Minutes per session: 30 min  . Stress: Not at all  Relationships  . Social connections:    Talks on phone: More than three times a week    Gets together: Twice a week    Attends religious service: Never    Active member of club or organization: No    Attends meetings of clubs or organizations: Never    Relationship status: Married  . Intimate partner violence:    Fear of current or ex partner: No    Emotionally abused: No    Physically abused: No    Forced sexual activity: No  Other Topics Concern  . Not on file  Social History Narrative  . Not on file      Review of Systems  Constitutional: Positive for fatigue. Negative for activity change, appetite change, chills and unexpected weight change.       Improved symptoms from his last visit.   HENT: Negative for congestion, postnasal drip, rhinorrhea, sinus pain, sneezing and sore throat.   Eyes: Negative.  Negative for redness.  Respiratory: Positive for chest tightness, shortness of breath and wheezing. Negative for cough.   Cardiovascular: Positive for chest pain. Negative for palpitations.  Gastrointestinal: Negative for abdominal pain, constipation, diarrhea, nausea and vomiting.  Endocrine: Negative for cold intolerance, heat intolerance, polydipsia, polyphagia and polyuria.  Genitourinary: Negative.  Negative for dysuria and frequency.  Musculoskeletal: Positive for arthralgias. Negative for back pain, joint swelling and neck pain.  Skin: Negative for rash.  Allergic/Immunologic: Positive for environmental allergies.  Neurological: Negative for tremors, numbness and headaches.  Hematological: Negative for adenopathy.  Psychiatric/Behavioral: Negative for behavioral problems  (Depression), sleep disturbance and suicidal ideas. The patient is nervous/anxious.     Today's Vitals   04/09/18 1007  BP: 135/76  Pulse: 77  Resp: 16  SpO2: 93%  Weight: 216 lb (98 kg)  Height: 6\' 2"  (1.88 m)    Physical Exam  Constitutional: He is oriented to person, place, and time. He appears well-developed and well-nourished. No distress.  HENT:  Head: Normocephalic and atraumatic.  Nose: Nose normal.  Mouth/Throat: Oropharynx is clear and moist. No oropharyngeal exudate.  Eyes: Pupils are equal, round, and reactive to light. Conjunctivae and EOM are normal.  Neck: Normal range of motion. Neck supple. No JVD present. Carotid bruit is not present. No tracheal deviation present. No thyromegaly present.  Cardiovascular: Normal rate, regular rhythm and intact distal pulses. Exam reveals no gallop and no friction rub.  No murmur heard. Heart rhythm slightly irregular   Pulmonary/Chest: Effort normal. Tachypnea noted. No respiratory distress. He has decreased breath sounds in the left lower field. He has no rales.  He exhibits tenderness.  Abdominal: Soft. Bowel sounds are normal. There is no tenderness.  Musculoskeletal: Normal range of motion.  Lymphadenopathy:    He has no cervical adenopathy.  Neurological: He is alert and oriented to person, place, and time. No cranial nerve deficit.  Skin: Skin is warm and dry. He is not diaphoretic.  Psychiatric: He has a normal mood and affect. His behavior is normal. Judgment and thought content normal.  Nursing note and vitals reviewed.   Assessment/Plan: 1. Essential hypertension Improved and stable. Continue clonidine as prescribed .  2. Chronic obstructive pulmonary disease, unspecified COPD type (HCC) Regular visits with pulmonology as scheduled.   3. SOB (shortness of breath) Should use rescue inhaler as needed and as prescribed .  General Counseling: jerime arif understanding of the findings of todays visit  and agrees with plan of treatment. I have discussed any further diagnostic evaluation that may be needed or ordered today. We also reviewed his medications today. he has been encouraged to call the office with any questions or concerns that should arise related to todays visit.  This patient was seen by Vincent Gros FNP Collaboration with Dr Lyndon Code as a part of collaborative care agreement    Time spent: 15 Minutes      Dr Lyndon Code Internal medicine

## 2018-05-06 ENCOUNTER — Other Ambulatory Visit: Payer: Self-pay

## 2018-05-06 DIAGNOSIS — I10 Essential (primary) hypertension: Secondary | ICD-10-CM

## 2018-05-06 MED ORDER — CLONIDINE HCL 0.1 MG PO TABS: 0.1000 mg | ORAL_TABLET | Freq: Two times a day (BID) | ORAL | 1 refills | Status: DC

## 2018-05-20 ENCOUNTER — Telehealth: Payer: Self-pay | Admitting: Nurse Practitioner

## 2018-05-20 NOTE — Telephone Encounter (Signed)
Both legs are swelling , took self off of blood pressure medication for week and noticed that legs went down, , and as soon started taking again legs swelled up again, would like a change in bp medication .

## 2018-05-21 NOTE — Telephone Encounter (Signed)
He will have to be seen then.

## 2018-05-21 NOTE — Telephone Encounter (Signed)
Yes

## 2018-05-21 NOTE — Telephone Encounter (Signed)
Yes that it the medication that he is talking about

## 2018-05-21 NOTE — Telephone Encounter (Signed)
The only blood pressure I have him on is clonidine and as needed. I believe he told me he wasn't taking this at all when I saw him. Is that the medication he is talking about?

## 2018-05-21 NOTE — Telephone Encounter (Signed)
Left message for patient to call and schedule an appointment with Herbert Seta

## 2018-10-07 ENCOUNTER — Encounter: Payer: Self-pay | Admitting: Nurse Practitioner

## 2018-10-07 ENCOUNTER — Ambulatory Visit (INDEPENDENT_AMBULATORY_CARE_PROVIDER_SITE_OTHER): Payer: Medicare Other | Admitting: Nurse Practitioner

## 2018-10-07 VITALS — BP 133/84 | HR 61 | Resp 16 | Ht 74.0 in | Wt 236.4 lb

## 2018-10-07 DIAGNOSIS — J449 Chronic obstructive pulmonary disease, unspecified: Secondary | ICD-10-CM

## 2018-10-07 DIAGNOSIS — R300 Dysuria: Secondary | ICD-10-CM | POA: Diagnosis not present

## 2018-10-07 DIAGNOSIS — Z0001 Encounter for general adult medical examination with abnormal findings: Secondary | ICD-10-CM | POA: Diagnosis not present

## 2018-10-07 DIAGNOSIS — I10 Essential (primary) hypertension: Secondary | ICD-10-CM

## 2018-10-07 MED ORDER — CLONIDINE HCL 0.1 MG PO TABS: 0.1000 mg | ORAL_TABLET | Freq: Two times a day (BID) | ORAL | 3 refills | Status: AC

## 2018-10-07 MED ORDER — FLUTICASONE-UMECLIDIN-VILANT 100-62.5-25 MCG/INH IN AEPB: 1.0000 | INHALATION_SPRAY | Freq: Every day | RESPIRATORY_TRACT | 11 refills | Status: AC

## 2018-10-07 NOTE — Progress Notes (Signed)
[facility] [address] [place], [place] [address]  Internal MEDICINE  Office Visit Note  Patient Name: Aaron Luna  [id number]  [id number]  Date of Service: 10/08/2018   Pt is here for routine health maintenance examination  Chief Complaint  Patient presents with  . Medicare Wellness    well visit, pt will be moving to [place] on Sunday, pt needs refills until he can find a primary dr in [place]  . COPD  . Hypertension     COPD  He complains of chest tightness, shortness of breath and wheezing. There is no cough. This is a chronic problem. The current episode started more than 1 year ago. The problem occurs constantly. The problem has been unchanged. Associated symptoms include appetite change, chest pain, heartburn, postnasal drip and rhinorrhea. Pertinent negatives include no headaches, sneezing or sore throat. His symptoms are aggravated by eating. His symptoms are alleviated by steroid inhaler and beta-agonist. He reports moderate improvement on treatment. His symptoms are not alleviated by steroid inhaler and beta-agonist. Risk factors for lung disease include smoking/tobacco exposure (history of large parenchymal mass ). His past medical history is significant for COPD.  Hypertension  This is a chronic problem. The current episode started more than 1 year ago. The problem is controlled. Associated symptoms include chest pain, palpitations and shortness of breath. Pertinent negatives include no headaches. Risk factors for coronary artery disease include male gender. Past treatments include calcium channel blockers and lifestyle changes. Compliance problems include diet and exercise.      Current Medication: Outpatient Encounter Medications as of 10/07/2018  Medication Sig Note  . albuterol (PROVENTIL HFA;VENTOLIN HFA) 108 (90 Base) MCG/ACT inhaler Inhale 2 puffs into the lungs every 4 (four) hours as needed for wheezing or shortness of breath.    . cloNIDine (CATAPRES) 0.1 MG tablet Take 1 tablet (0.1 mg total) by mouth 2 (two) times daily.   Marland Kitchen docusate sodium (COLACE) 100 MG capsule Take 100 mg by mouth 2 (two) times daily. 12/04/2017: Pt taking 2 capsules once a day   . Fluticasone-Umeclidin-Vilant (TRELEGY ELLIPTA) 100-62.5-25 MCG/INH AEPB Inhale 1 puff into the lungs daily.   . magnesium gluconate (MAGONATE) 500 MG tablet Take 500 mg by mouth 2 (two) times daily. 12/11/2017: Pt taking the 400 mg capsule   . Magnesium Oxide 250 MG TABS Take by mouth. Pt taking 2 tablets once a day   . naproxen sodium (ALEVE) 220 MG tablet Take 220 mg by mouth. As needed.   . Tiotropium Bromide-Olodaterol (STIOLTO RESPIMAT) 2.5-2.5 MCG/ACT AERS Inhale 1 puff into the lungs daily.   . [DISCONTINUED] amoxicillin-clavulanate (AUGMENTIN) 875-125 MG tablet Take 1 tablet by mouth every 12 (twelve) hours. (Patient not taking: Reported on 12/04/2017) 12/04/2017: Completed   . [DISCONTINUED] cloNIDine (CATAPRES) 0.1 MG tablet Take 1 tablet (0.1 mg total) by mouth 2 (two) times daily.   . [DISCONTINUED] doxycycline (VIBRA-TABS) 100 MG tablet Take 1 tablet (100 mg total) by mouth every 12 (twelve) hours. (Patient not taking: Reported on 12/04/2017) 12/04/2017: Completed    No facility-administered encounter medications on file as of 10/07/2018.     Surgical History: Past Surgical History:  Procedure Laterality Date  . CHOLECYSTECTOMY    . TONSILLECTOMY      Medical History: Past Medical History:  Diagnosis Date  . Empyema (HCC) 11/20/2017  . Essential hypertension   . Hyperglycemia   . Obstructive chronic bronchitis without exacerbation (HCC) 11/20/2017  . PSVT (paroxysmal supraventricular tachycardia) (HCC)  a. 10/2012-->coverted with adenosine in ED (6-->12mg );  b 10/2012 Neg MV, EF 59%; c. 10/2012 Echo: EF 55%, nl valves.  . Tobacco abuse     Family History: Family History  Problem Relation Age of Onset  . Alzheimer's disease Mother   . Heart disease  Father 85       CABG x 4  . Hypertension Sister       Review of Systems  Constitutional: Positive for appetite change and fatigue.  HENT: Positive for postnasal drip and rhinorrhea. Negative for sneezing and sore throat.   Respiratory: Positive for shortness of breath and wheezing. Negative for cough.   Cardiovascular: Positive for chest pain and palpitations.  Gastrointestinal: Positive for heartburn.  Endocrine: Negative for cold intolerance, heat intolerance, polydipsia and polyuria.  Musculoskeletal: Positive for back pain.  Allergic/Immunologic: Positive for environmental allergies.  Neurological: Negative for headaches.  Hematological: Negative for adenopathy.  Psychiatric/Behavioral: Negative for agitation and dysphoric mood. The patient is not nervous/anxious.      Today's Vitals   10/07/18 1450  BP: 133/84  Pulse: 61  Resp: 16  SpO2: 97%  Weight: 236 lb 6.4 oz (107.2 kg)  Height: 6\' 2"  (1.88 m)   Body mass index is 30.35 kg/m.  Physical Exam Vitals signs and nursing note reviewed.  Constitutional:      General: He is not in acute distress.    Appearance: Normal appearance. He is well-developed. He is not diaphoretic.  HENT:     Head: Normocephalic and atraumatic.     Mouth/Throat:     Pharynx: No oropharyngeal exudate.  Eyes:     Conjunctiva/sclera: Conjunctivae normal.     Pupils: Pupils are equal, round, and reactive to light.  Neck:     Musculoskeletal: Normal range of motion and neck supple.     Thyroid: No thyromegaly.     Vascular: No carotid bruit or JVD.     Trachea: No tracheal deviation.  Cardiovascular:     Rate and Rhythm: Normal rate and regular rhythm.     Pulses: Normal pulses.     Heart sounds: Normal heart sounds. No murmur. No friction rub. No gallop.   Pulmonary:     Effort: Pulmonary effort is normal. No respiratory distress.     Breath sounds: No wheezing or rales.     Comments: Breath sounds are diminished but clear throughout  the lung fields. Use of accessory muscles is evident.  Chest:     Chest wall: No tenderness.  Abdominal:     General: Bowel sounds are normal.     Palpations: Abdomen is soft.  Musculoskeletal: Normal range of motion.  Lymphadenopathy:     Cervical: No cervical adenopathy.  Skin:    General: Skin is warm and dry.  Neurological:     General: No focal deficit present.     Mental Status: He is alert and oriented to person, place, and time.     Cranial Nerves: No cranial nerve deficit.  Psychiatric:        Behavior: Behavior normal.        Thought Content: Thought content normal.        Judgment: Judgment normal.    Depression screen [facility] 2/9 10/07/2018 04/09/2018 12/11/2017 10/03/2017 09/01/2017  Decreased Interest 0 0 0 0 0  Down, Depressed, Hopeless 0 0 0 0 0  PHQ - 2 Score 0 0 0 0 0    Functional Status Survey: Is the patient deaf or have difficulty hearing?: No Does  the patient have difficulty seeing, even when wearing glasses/contacts?: Yes(occasionally) Does the patient have difficulty concentrating, remembering, or making decisions?: No Does the patient have difficulty walking or climbing stairs?: Yes(knee trouble, occasionally) Does the patient have difficulty dressing or bathing?: No Does the patient have difficulty doing errands alone such as visiting a doctor's office or shopping?: No  MMSE - Mini Mental State Exam 10/07/2018  Orientation to time 5  Orientation to Place 5  Registration 3  Attention/ Calculation 5  Recall 3  Language- name 2 objects 2  Language- repeat 1  Language- follow 3 step command 3  Language- read & follow direction 1  Write a sentence 1  Copy design 1  Total score 30    Fall Risk  10/07/2018 04/09/2018 12/11/2017 10/03/2017 09/01/2017  Falls in the past year? 0 No No No No     Assessment/Plan: 1. Encounter for general adult medical examination with abnormal findings Annual health maintenance exam today.  2. Chronic obstructive pulmonary  disease, unspecified COPD type (HCC) Change stialto to trelegy. Instructions provided for use. Sample provided and new prescription sent to pharmacy.  - Fluticasone-Umeclidin-Vilant (TRELEGY ELLIPTA) 100-62.5-25 MCG/INH AEPB; Inhale 1 puff into the lungs daily.  Dispense: 1 each; Refill: 11  3. Essential hypertension Stable. Continue clonidine as prescribed  - cloNIDine (CATAPRES) 0.1 MG tablet; Take 1 tablet (0.1 mg total) by mouth 2 (two) times daily.  Dispense: 180 tablet; Refill: 3  4. Dysuria - UA/M w/rflx Culture, Routine  General Counseling: Aaron Luna understanding of the findings of todays visit and agrees with plan of treatment. I have discussed any further diagnostic evaluation that may be needed or ordered today. We also reviewed his medications today. he has been encouraged to call the office with any questions or concerns that should arise related to todays visit.    Counseling:  Hypertension Counseling:   The following hypertensive lifestyle modification were recommended and discussed:  1. Limiting alcohol intake to less than 1 oz/day of ethanol:(24 oz of beer or 8 oz of wine or 2 oz of 100-proof whiskey). 2. Take baby ASA 81 mg daily. 3. Importance of regular aerobic exercise and losing weight. 4. Reduce dietary saturated fat and cholesterol intake for overall cardiovascular health. 5. Maintaining adequate dietary potassium, calcium, and magnesium intake. 6. Regular monitoring of the blood pressure. 7. Reduce sodium intake to less than 100 mmol/day (less than 2.3 gm of sodium or less than 6 gm of sodium choride)   This patient was seen by Vincent Gros FNP Collaboration with Dr Lyndon Code as a part of collaborative care agreement  Orders Placed This Encounter  Procedures  . Microscopic Examination  . UA/M w/rflx Culture, Routine    Meds ordered this encounter  Medications  . Fluticasone-Umeclidin-Vilant (TRELEGY ELLIPTA) 100-62.5-25 MCG/INH AEPB    Sig:  Inhale 1 puff into the lungs daily.    Dispense:  1 each    Refill:  11    Change from stiolto as patient needing inhaled corticosteroid.    Order Specific Question:   Supervising Provider    Answer:   Lyndon Code [[id number]]  . cloNIDine (CATAPRES) 0.1 MG tablet    Sig: Take 1 tablet (0.1 mg total) by mouth 2 (two) times daily.    Dispense:  180 tablet    Refill:  3    Pt needs to contact office to schedule future appointment and refill, Thank you.    Order Specific Question:   Supervising Provider  Answer:   Lavera Guise [[id number]]    Time spent: Dris[place], MD  Internal Medicine

## 2018-10-08 ENCOUNTER — Telehealth: Payer: Self-pay

## 2018-10-08 DIAGNOSIS — Z0001 Encounter for general adult medical examination with abnormal findings: Secondary | ICD-10-CM | POA: Insufficient documentation

## 2018-10-08 LAB — UA/M W/RFLX CULTURE, ROUTINE
Bilirubin, UA: NEGATIVE
Glucose, UA: NEGATIVE
Ketones, UA: NEGATIVE
Leukocytes, UA: NEGATIVE
Nitrite, UA: NEGATIVE
PH UA: 5.5 (ref 5.0–7.5)
PROTEIN UA: NEGATIVE
RBC, UA: NEGATIVE
SPEC GRAV UA: 1.013 (ref 1.005–1.030)
Urobilinogen, Ur: 0.2 mg/dL (ref 0.2–1.0)

## 2018-10-08 LAB — MICROSCOPIC EXAMINATION
Bacteria, UA: NONE SEEN
CASTS: NONE SEEN /LPF
EPITHELIAL CELLS (NON RENAL): NONE SEEN /HPF (ref 0–10)
WBC UA: NONE SEEN /HPF (ref 0–5)

## 2018-10-09 ENCOUNTER — Other Ambulatory Visit: Payer: Self-pay | Admitting: Nurse Practitioner

## 2018-10-09 DIAGNOSIS — J449 Chronic obstructive pulmonary disease, unspecified: Secondary | ICD-10-CM

## 2018-10-09 MED ORDER — TIOTROPIUM BROMIDE-OLODATEROL 2.5-2.5 MCG/ACT IN AERS: 1.0000 | INHALATION_SPRAY | Freq: Every day | RESPIRATORY_TRACT | 11 refills | Status: DC

## 2018-10-09 NOTE — Progress Notes (Signed)
Changed back to stiolto per expense and sent new prescription to [facility].

## 2018-10-09 NOTE — Telephone Encounter (Signed)
Changed back to stiolto per expense and sent new prescription to [facility].  

## 2018-10-09 NOTE — Telephone Encounter (Signed)
Pt advised we change med and send to [facility]  

## 2018-10-15 ENCOUNTER — Ambulatory Visit: Payer: Self-pay | Admitting: Nurse Practitioner

## 2019-10-19 ENCOUNTER — Other Ambulatory Visit: Payer: Self-pay

## 2019-10-19 DIAGNOSIS — J449 Chronic obstructive pulmonary disease, unspecified: Secondary | ICD-10-CM

## 2019-10-19 DIAGNOSIS — J4489 Other specified chronic obstructive pulmonary disease: Secondary | ICD-10-CM

## 2019-10-19 MED ORDER — STIOLTO RESPIMAT 2.5-2.5 MCG/ACT IN AERS: 1.0000 | INHALATION_SPRAY | Freq: Every day | RESPIRATORY_TRACT | 0 refills | Status: AC

## 2019-10-22 ENCOUNTER — Ambulatory Visit: Payer: Medicare Other | Admitting: Nurse Practitioner

## 2019-10-22 ENCOUNTER — Telehealth: Payer: Self-pay

## 2019-10-22 NOTE — Telephone Encounter (Signed)
Patient cancelled appointment on 10/22/2019 [place]. [facility]
